# Patient Record
Sex: Female | Born: 1970 | Race: White | Hispanic: No | Marital: Married | Smoking: Former smoker
Health system: Southern US, Community
[De-identification: ages and names within clinical notes are randomized; demographics above are authoritative.]

## PROBLEM LIST (undated history)

## (undated) DIAGNOSIS — I639 Cerebral infarction, unspecified: Secondary | ICD-10-CM

## (undated) DIAGNOSIS — R569 Unspecified convulsions: Secondary | ICD-10-CM

## (undated) DIAGNOSIS — Q2730 Arteriovenous malformation, site unspecified: Secondary | ICD-10-CM

## (undated) HISTORY — DX: Cerebral infarction, unspecified: I63.9

## (undated) HISTORY — PX: ANKLE FRACTURE SURGERY: SHX122

---

## 2000-10-14 ENCOUNTER — Encounter: Payer: Self-pay | Admitting: Emergency Medicine

## 2000-10-14 ENCOUNTER — Inpatient Hospital Stay (HOSPITAL_COMMUNITY): Admission: AC | Admit: 2000-10-14 | Discharge: 2000-10-18 | Payer: Self-pay

## 2001-08-27 ENCOUNTER — Other Ambulatory Visit: Admission: RE | Admit: 2001-08-27 | Discharge: 2001-08-27 | Payer: Self-pay | Admitting: Obstetrics and Gynecology

## 2001-12-30 ENCOUNTER — Other Ambulatory Visit: Admission: RE | Admit: 2001-12-30 | Discharge: 2001-12-30 | Payer: Self-pay | Admitting: Obstetrics and Gynecology

## 2003-02-10 ENCOUNTER — Other Ambulatory Visit: Admission: RE | Admit: 2003-02-10 | Discharge: 2003-02-10 | Payer: Self-pay | Admitting: Obstetrics and Gynecology

## 2003-05-12 ENCOUNTER — Other Ambulatory Visit: Admission: RE | Admit: 2003-05-12 | Discharge: 2003-05-12 | Payer: Self-pay | Admitting: Obstetrics and Gynecology

## 2008-08-19 ENCOUNTER — Ambulatory Visit: Payer: Self-pay | Admitting: Neurology

## 2009-09-30 ENCOUNTER — Ambulatory Visit: Payer: Self-pay | Admitting: Obstetrics and Gynecology

## 2010-04-17 ENCOUNTER — Encounter: Payer: Self-pay | Admitting: Neurosurgery

## 2010-07-05 ENCOUNTER — Other Ambulatory Visit: Payer: Self-pay

## 2010-07-05 ENCOUNTER — Other Ambulatory Visit: Payer: Self-pay | Admitting: Allergy

## 2010-07-05 DIAGNOSIS — J309 Allergic rhinitis, unspecified: Secondary | ICD-10-CM

## 2012-06-03 ENCOUNTER — Emergency Department (HOSPITAL_COMMUNITY)
Admission: EM | Admit: 2012-06-03 | Discharge: 2012-06-03 | Disposition: A | Discharge status: Home / Self Care | Payer: Federal, State, Local not specified - PPO | Attending: Emergency Medicine | Admitting: Emergency Medicine

## 2012-06-03 ENCOUNTER — Emergency Department (HOSPITAL_COMMUNITY): Payer: Federal, State, Local not specified - PPO

## 2012-06-03 ENCOUNTER — Encounter (HOSPITAL_COMMUNITY): Payer: Self-pay | Admitting: *Deleted

## 2012-06-03 DIAGNOSIS — R110 Nausea: Secondary | ICD-10-CM | POA: Insufficient documentation

## 2012-06-03 DIAGNOSIS — G40109 Localization-related (focal) (partial) symptomatic epilepsy and epileptic syndromes with simple partial seizures, not intractable, without status epilepticus: Secondary | ICD-10-CM | POA: Insufficient documentation

## 2012-06-03 DIAGNOSIS — R51 Headache: Secondary | ICD-10-CM | POA: Insufficient documentation

## 2012-06-03 DIAGNOSIS — Z3202 Encounter for pregnancy test, result negative: Secondary | ICD-10-CM | POA: Insufficient documentation

## 2012-06-03 DIAGNOSIS — Z87891 Personal history of nicotine dependence: Secondary | ICD-10-CM | POA: Insufficient documentation

## 2012-06-03 DIAGNOSIS — Z8679 Personal history of other diseases of the circulatory system: Secondary | ICD-10-CM | POA: Insufficient documentation

## 2012-06-03 HISTORY — DX: Unspecified convulsions: R56.9

## 2012-06-03 HISTORY — DX: Arteriovenous malformation, site unspecified: Q27.30

## 2012-06-03 MED ORDER — METOCLOPRAMIDE HCL 5 MG/ML IJ SOLN
10.0000 mg | Freq: Once | INTRAMUSCULAR | Status: AC
  Administered 2012-06-03: 10 mg via INTRAVENOUS
  Filled 2012-06-03: qty 2

## 2012-06-03 MED ORDER — DIPHENHYDRAMINE HCL 50 MG/ML IJ SOLN
25.0000 mg | INTRAMUSCULAR | Status: AC
  Administered 2012-06-03: 25 mg via INTRAVENOUS
  Filled 2012-06-03: qty 1

## 2012-06-03 MED ORDER — LEVETIRACETAM 500 MG/5ML IV SOLN
INTRAVENOUS | Status: AC
  Filled 2012-06-03: qty 10

## 2012-06-03 MED ORDER — MORPHINE SULFATE 4 MG/ML IJ SOLN
4.0000 mg | Freq: Once | INTRAMUSCULAR | Status: AC
  Administered 2012-06-03: 4 mg via INTRAVENOUS
  Filled 2012-06-03: qty 1

## 2012-06-03 MED ORDER — SODIUM CHLORIDE 0.9 % IV SOLN
1000.0000 mg | Freq: Once | INTRAVENOUS | Status: AC
  Administered 2012-06-03: 1000 mg via INTRAVENOUS
  Filled 2012-06-03: qty 10

## 2012-06-03 NOTE — ED Notes (Signed)
Patient alert and oriented x 4. No acute distress noted. No seizure activity while in ED. Patient discharged to home with husband. Patient and husband verbalized understanding of discharge instructions.

## 2012-06-03 NOTE — ED Notes (Signed)
Patient w/hx AVM and seizures had seizure tonight.  Has been 1 year since last seizure.  Slight nausea.  No sz. Activity in route.  Sees neurologist at [facility].

## 2012-06-03 NOTE — ED Provider Notes (Signed)
History    This chart was scribed for Jennifer Booze, MD by Gerlean Ren, ED Scribe. This patient was seen in room APA04/APA04 and the patient's care was started at 7:25 PM    CSN: [id number]  Arrival date & time 06/03/12  1900   First MD Initiated Contact with Patient 06/03/12 1924      Chief Complaint  Patient presents with  . Seizures     The history is provided by the patient and the spouse. No language interpreter was used.  Jennifer Pope is a 42 y.o. female with h/o seizures and arteriovenous malformation brought in by ambulance to the Emergency Department complaining of a seizure that occurred today lasting approximately 10-20 seconds during which the pt was conscious and reports shaking in her left upper extremity and left lower extremity and loss of bowel control, which are typical to her h/o seizures.  Pt reports that she normally can sense when a seizure is about to occur but that she had no warning signs with this seizure.  Did not bite lips or tongue, and there are not associated injuries or traumas with the seizure.  Pt currently c/o HA and nausea.  Pt reports last seizure occurred one year ago.  Pt takes Sheralyn Boatman and has not missed any doses.  Pt denies tobacco use and reports daily alcohol use (2beers/day).   Neurologist is Dr. Amada Jupiter at [facility]   Past Medical History  Diagnosis Date  . Seizures   . Arteriovenous malformation     Past Surgical History  Procedure Laterality Date  . Ankle fracture surgery      No family history on file.  History  Substance Use Topics  . Smoking status: Former Games developer  . Smokeless tobacco: Not on file  . Alcohol Use: 1.2 oz/week    2 Cans of beer per week     Comment: 2 beers/day    OB History   Grav Para Term Preterm Abortions TAB SAB Ect Mult Living   0               Review of Systems  Gastrointestinal: Positive for nausea.  Skin: Negative for wound.  Neurological: Positive for seizures and headaches.  All other  systems reviewed and are negative.    Allergies  Review of patient's allergies indicates no known allergies.  Home Medications  No current outpatient prescriptions on file.  BP 102/57  Pulse 64  Temp(Src) 97.7 F (36.5 C) (Oral)  Resp 16  Ht 5\' 6"  (1.676 m)  Wt 150 lb (68.04 kg)  BMI 24.22 kg/m2  SpO2 96%  LMP 04/27/2012  Physical Exam  Nursing note and vitals reviewed. Constitutional: She is oriented to person, place, and time. She appears well-developed and well-nourished. No distress.  HENT:  Head: Normocephalic and atraumatic.  Eyes: Conjunctivae and EOM are normal. Pupils are equal, round, and reactive to light.  Fundi normal  Neck: Neck supple. No tracheal deviation present.  Cardiovascular: Normal rate, regular rhythm and normal heart sounds.   Pulmonary/Chest: Effort normal and breath sounds normal. No respiratory distress. She has no wheezes.  Musculoskeletal: Normal range of motion.  Neurological: She is alert and oriented to person, place, and time.  Skin: Skin is warm and dry.  Psychiatric: She has a normal mood and affect. Her behavior is normal.    ED Course  Procedures (including critical care time) DIAGNOSTIC STUDIES: Oxygen Saturation is 96% on room air, adequate by my interpretation.    COORDINATION OF CARE:  7:34 PM- Patient and husband informed of clinical course including nausea relief, pain relief, and consult with neurologist.  They understand medical decision-making process and agree with plan.  Ct Head Wo Contrast  06/03/2012  *RADIOLOGY REPORT*  Clinical Data: Seizure.  History of avium.  CT HEAD WITHOUT CONTRAST  Technique:  Contiguous axial images were obtained from the base of the skull through the vertex without contrast.  Comparison: None.  Findings: There is extensive hypodensity in the  white matter of the right frontal lobe without associated mass effect likely representing ischemic changes.  Dystrophic calcifications are present within  the medial right frontal lobe.  There is no evidence of midline shift or acute intracranial hemorrhage.  Cranium is intact.  Mastoid air cells are clear.  IMPRESSION: Chronic ischemic changes and dystrophic calcifications in the right frontal lobe are likely related to the patient's known history of AVM.  No evidence of acute hemorrhage or acute intracranial pathology.   Original Report Authenticated By: Jolaine Click, M.D.       1. 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 Her neurologist is at [facility] and I will attempt to contact him. She is currently on Keppra and blood levels are not obtainable for that. She is neurologically intact and I do not see any indication for any immediate change in treatment. She will be given metoclopramide for nausea since ondansetron did not give her relief. She was given morphine for her headache. I have attempted to review her records from [facility] but very limited information is available via EPIC.  Have discussed case with Dr. Micheline Maze at [facility] who has requested that a CT of the head be obtained to make sure that her AVM has not bled, and recommended increasing her Keppra from 1250 mg a day to 2000 mg a day. She will be seen in followup air either tomorrow or Friday.   I personally performed the services described in this documentation, which was scribed in my presence. The recorded information has been reviewed and is accurate.          Jennifer Booze, MD 06/03/12 [phone]

## 2012-06-04 LAB — POCT PREGNANCY, URINE: Preg Test, Ur: NEGATIVE

## 2012-11-06 ENCOUNTER — Encounter: Payer: Federal, State, Local not specified - PPO | Admitting: Neurology

## 2012-11-06 NOTE — Progress Notes (Deleted)
[facility]  Provider:  Dr Hosie Poisson Referring Provider: Cassell Clement, MD Primary Care Physician:  Cassell Clement, MD  CC:  seizures  HPI:  Jennifer Pope is a 42 y.o. female here as a referral from Dr. Waverly Ferrari for evaluation of seizures in patient with a known AVM  Last seizure was around 1 month ago? Similar to prior episodes? Start on left side? Aura? Keppra dose and compliance?  Per ED notes:   The history is provided by the patient and the spouse. No language interpreter was used.  Jennifer Pope is a 42 y.o. female with h/o seizures and arteriovenous malformation brought in by ambulance to the Emergency Department complaining of a seizure that occurred today lasting approximately 10-20 seconds during which the pt was conscious and reports shaking in her left upper extremity and left lower extremity and loss of bowel control, which are typical to her h/o seizures. Pt reports that she normally can sense when a seizure is about to occur but that she had no warning signs with this seizure. Did not bite lips or tongue, and there are not associated injuries or traumas with the seizure. Pt currently c/o HA and nausea. Pt reports last seizure occurred one year ago. Pt takes Sheralyn Boatman and has not missed any doses. Pt denies tobacco use and reports daily alcohol use (2beers/day).  Followed by radiation oncologist Dr. Amada Jupiter at [facility] CT from 05/2012, images reviewed and showed right frontal area of hypodensity with calcifications, consistent with known AVM   Review of Systems: Out of a complete 14 system review, the patient complains of only the following symptoms, and all other reviewed systems are negative. ***  History   Social History  . Marital Status: Married    Spouse Name: N/A    Number of Children: N/A  . Years of Education: N/A   Occupational History  . Not on file.   Social History Main Topics  . Smoking status: Former Games developer  .  Smokeless tobacco: Not on file  . Alcohol Use: 1.2 oz/week    2 Cans of beer per week     Comment: 2 beers/day  . Drug Use: No  . Sexual Activity: Yes    Birth Control/ Protection: None   Other Topics Concern  . Not on file   Social History Narrative  . No narrative on file    No family history on file.  Past Medical History  Diagnosis Date  . Seizures   . Arteriovenous malformation     Past Surgical History  Procedure Laterality Date  . Ankle fracture surgery      Current Outpatient Prescriptions  Medication Sig Dispense Refill  . levETIRAcetam (KEPPRA) 500 MG tablet Take 500-750 mg by mouth 2 (two) times daily. Take one tablet (500 MG) by mouth every morning then one and 1/2 tablet (750MG ) by mouth every evening       No current facility-administered medications for this visit.    Allergies as of 11/06/2012  . (No Known Allergies)    Vitals: There were no vitals taken for this visit. Last Weight:  Wt Readings from Last 1 Encounters:  06/03/12 150 lb (68.04 kg)   Last Height:   Ht Readings from Last 1 Encounters:  06/03/12 5\' 6"  (1.676 m)     Physical exam: Exam: Gen: NAD, conversant Eyes: anicteric sclerae, moist conjunctivae HENT: Atraumati Neck: Trachea midline; supple,  Lungs: CTA, no wheezing, rales, rhonic  CV: RRR, no MRG Abdomen: Soft, non-tender;  Extremities: No peripheral edema  Skin: Normal temperature, no rash,  Psych: Appropriate affect, pleasant  Neuro: MS: AA&Ox3, appropriately interactive, normal affect   Attention: WORLD backwards  Speech: fluent w/o paraphasic error  Memory: good recent and remote recall  CN: PERRL, EOMI no nystagmus, no ptosis, sensation intact to LT V1-V3 bilat, face symmetric, no weakness, hearing grossly intact, palate elevates symmetrically, shoulder shrug 5/5 bilat,  tongue protrudes midline, no fasiculations noted.  Motor: normal bulk and tone Strength: 5/5  In all  extremities  Coord: rapid alternating and point-to-point (FNF, HTS) movements intact.  Reflexes: symmetrical, bilat downgoing toes  Sens: LT intact in all extremities  Gait: posture, stance, stride and arm-swing normal. Tandem gait intact. Able to walk on heels and toes. Romberg absent.   Assessment:  After physical and neurologic examination, review of laboratory studies, imaging, neurophysiology testing and pre-existing records, assessment will be reviewed on the problem list.  Plan:  Treatment plan and additional workup will be reviewed under Problem List.

## 2012-11-07 NOTE — Progress Notes (Signed)
This encounter was created in error - please disregard.

## 2012-12-02 ENCOUNTER — Encounter: Payer: Self-pay | Admitting: Neurology

## 2012-12-02 ENCOUNTER — Ambulatory Visit (INDEPENDENT_AMBULATORY_CARE_PROVIDER_SITE_OTHER): Payer: Federal, State, Local not specified - PPO | Admitting: Neurology

## 2012-12-02 VITALS — BP 104/64 | HR 62 | Ht 67.0 in | Wt 169.0 lb

## 2012-12-02 DIAGNOSIS — R569 Unspecified convulsions: Secondary | ICD-10-CM | POA: Insufficient documentation

## 2012-12-02 DIAGNOSIS — Q2730 Arteriovenous malformation, site unspecified: Secondary | ICD-10-CM | POA: Insufficient documentation

## 2012-12-02 DIAGNOSIS — Q279 Congenital malformation of peripheral vascular system, unspecified: Secondary | ICD-10-CM

## 2012-12-02 MED ORDER — LEVETIRACETAM 500 MG PO TABS: 250.0000 mg | ORAL_TABLET | Freq: Two times a day (BID) | ORAL | Status: DC

## 2012-12-02 MED ORDER — LORAZEPAM 0.5 MG PO TABS: 0.5000 mg | ORAL_TABLET | ORAL | Status: AC | PRN

## 2012-12-02 MED ORDER — LEVETIRACETAM 1000 MG PO TABS: 1000.0000 mg | ORAL_TABLET | Freq: Every day | ORAL | Status: DC

## 2012-12-02 NOTE — Progress Notes (Signed)
[facility]  Provider:  Dr Hosie Poisson Referring Provider: Cassell Clement, MD Primary Care Physician:  Cassell Clement, MD  CC:  seizures  HPI:  Jennifer Pope is a 42 y.o. female here as a referral from Dr. Waverly Ferrari for evaluation of seizures in a patient with known AVM.   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 She was followed by oncology at [facility] and had stereotactic radiosurgery done, she completed this in March of 2011. She was initially put on Keppra 500 mg twice a day for her seizure episodes, this has been slowly titrated up and is currently taking 1000 mg twice a day. Up until March of 2014 her seizures remained simple partial involving sensory changes on the left side. In March she had what appears to be a complex partial seizure with flexion of the left arm, confusion, difficulty talking. This lasted a few minutes. She had a subsequent episode similar to this in July of 2014 which occurred during her sleep. In August she had what she thought was the start of one but she stopped it with Ativan. She reports good compliance with her Keppra. She also takes Ativan 0.5 mg as needed when she feels the aura coming on. Lately she feels that she has been using this on a more continuous daily basis. She feels that her mental cycle may trigger seizures. Denies any other triggers. No alcohol or tobacco use. Completed March 2011, diagnosed December 2010 after having seizures, left sided symptoms   Review of Systems: Out of a complete 14 system review, the patient complains of only the following symptoms, and all other reviewed systems are negative. Positive for headache numbness dizziness seizure insomnia decreased energy constipation  History   Social History  . Marital Status: Married    Spouse Name: Ladene Artist     Number of Children: 2  . 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   Social History Main Topics  . Smoking status: Former Smoker    Types: Cigarettes  . Smokeless tobacco: Never Used     Comment: Quit 16 years ago.  . Alcohol Use: 1.2 oz/week    2 Cans of beer per week     Comment: 2 beers/day  . Drug Use: No  . Sexual Activity: Yes    Birth Control/ Protection: None   Other Topics Concern  . Not on file   Social History Narrative   Patient lives at home with her husband Ladene Artist). Patient works for her self. Patient has her BA arts.   Caffeine- two daily coffee.   Right handed.    Family History  Problem Relation Age of Onset  . Lung cancer Father     Past Medical History  Diagnosis Date  . Seizures   . Arteriovenous malformation     Past Surgical History  Procedure Laterality Date  . Ankle fracture surgery Right     2002    Current Outpatient Prescriptions  Medication Sig Dispense Refill  . levETIRAcetam (KEPPRA) 1000 MG tablet Take 1,000 mg by mouth daily. Take 1 tablet (1,000 mg total) by mouth 2 (two) times daily.      Marland Kitchen levETIRAcetam (KEPPRA) 500 MG tablet Take 500-750 mg by mouth 2 (two) times daily. Take one tablet (500 MG) by mouth every morning then one  and 1/2 tablet (750MG ) by mouth every evening      . LORazepam (ATIVAN) 0.5 MG tablet Take 0.5 mg by mouth as needed. Take one tablet every 8 hrs as needed for onset of seizure       No current facility-administered medications for this visit.    Allergies as of 12/02/2012  . (No Known Allergies)    Vitals: BP 104/64  Pulse 62  Ht 5\' 7"  (1.702 m)  Wt 169 lb (76.658 kg)  BMI 26.46 kg/m2 Last Weight:  Wt Readings from Last 1 Encounters:  12/02/12 169 lb (76.658 kg)   Last Height:   Ht Readings from Last 1 Encounters:  12/02/12 5\' 7"  (1.702 m)     Physical exam: Exam: Gen: NAD, conversant Eyes: anicteric sclerae, moist conjunctivae HENT:  Atraumati Neck: Trachea midline; supple,  Lungs: CTA, no wheezing, rales, rhonic                          CV: RRR, no MRG Abdomen: Soft, non-tender;  Extremities: No peripheral edema  Skin: Normal temperature, no rash,  Psych: Appropriate affect, pleasant  Neuro: MS: AA&Ox3, appropriately interactive, normal affect   Attention: WORLD backwards  Speech: fluent w/o paraphasic error  Memory: good recent and remote recall  CN: PERRL, EOMI no nystagmus, no ptosis, sensation intact to LT V1-V3 bilat, face symmetric, no weakness, hearing grossly intact, palate elevates symmetrically, shoulder shrug 5/5 bilat,  tongue protrudes midline, no fasiculations noted.  Motor: normal bulk and tone Strength: 5/5  In all extremities  Coord: rapid alternating and point-to-point (FNF, HTS) movements intact.  Reflexes: symmetrical, bilat downgoing toes  Sens: LT intact in all extremities except for decreased light touch on left upper extremity from a low to finger taps and left lower 70 from the 2 toes  Gait: posture, stance, stride and arm-swing normal. Tandem gait intact. Able to walk on heels and toes. Romberg absent.   Assessment:  After physical and neurologic examination, review of laboratory studies, imaging, neurophysiology testing and pre-existing records, assessment will be reviewed on the problem list.  Plan:  Treatment plan and additional workup will be reviewed under Problem List.  Ms Weisheit is a pleasant 42 year old woman presents for initial evaluation of seizures with a known history of right frontal AVM. She is status post stereotactic radiosurgery at [facility]. She reports having what she feels is increased seizure frequency. She is currently taking Keppra 1 g twice a day and Ativan as needed. Her physical exam is pertinent for mild decrease sensation in the left side otherwise unremarkable. We extensively talked about different therapeutic options, at this time we will  increase the Keppra to 1250 twice a day with option of going up to 1500 mg twice a day. She will also continue to use Ativan when necessary, counseled her that she may need to increase the Ativan usage during her menstrual cycle if this is a trigger.  1)Seizures 2)AVM  -increase keppra to 1250mg  bid -ativan prn for breakthrough seizures -follow up with [facility] -keep appointment with [facility]   -follow up as needed

## 2012-12-02 NOTE — Patient Instructions (Signed)
Overall you are doing fairly well but I do want to suggest a few things today:   Remember to drink plenty of fluid, eat healthy meals and do not skip any meals. Try to eat protein with a every meal and eat a healthy snack such as fruit or nuts in between meals. Try to keep a regular sleep-wake schedule and try to exercise daily, particularly in the form of walking, 20-30 minutes a day, if you can.   As far as your medications are concerned, I would like to suggest increasing the keppra to 1250mg  twice a day. If this does not improve your seizure frequency then we can consider increasing to 1500mg  twice a day.   Continue to use the ativan as needed for breakthrough seizures.   I would like to see you back in 4 to 6 months, sooner if we need to. Please call us with any interim questions, concerns, problems, updates or refill requests.   My clinical assistant and will answer any of your questions and relay your messages to me and also relay most of my messages to you.   Our phone number is [phone]. We also have an after hours call service for urgent matters and there is a physician on-call for urgent questions. For any emergencies you know to call 911 or go to the nearest emergency room

## 2012-12-04 ENCOUNTER — Telehealth: Payer: Self-pay

## 2012-12-04 MED ORDER — LEVETIRACETAM 500 MG PO TABS: 250.0000 mg | ORAL_TABLET | Freq: Two times a day (BID) | ORAL | Status: AC

## 2012-12-04 MED ORDER — LEVETIRACETAM 1000 MG PO TABS: 1000.0000 mg | ORAL_TABLET | Freq: Two times a day (BID) | ORAL | Status: AC

## 2012-12-04 NOTE — Telephone Encounter (Signed)
Iona Hansen from [facility] called, left message wanting to clarify Keppra Rx's.  One Rx has two sets of instructions.  The OV notes say: increase keppra to 1250mg  bid.  I have updated and resent Rx.

## 2013-04-06 ENCOUNTER — Ambulatory Visit: Payer: Federal, State, Local not specified - PPO | Admitting: Neurology

## 2014-05-12 IMAGING — CT CT HEAD W/O CM
1 of 2 series · 16 of 30 positions shown, 20 images · non-contrast
Comparison: None.

CLINICAL DATA: Seizure.  History of avium.

CT HEAD WITHOUT CONTRAST
TECHNIQUE: Contiguous axial images were obtained from the base of
the skull through the vertex without contrast.

[Series 3: headtrauma 2.4 h60s · axial · 0.43mm/px · z∈[+115,+276]mm · 16 of 72 slices shown, 20 images]
[im 4/72  brain]
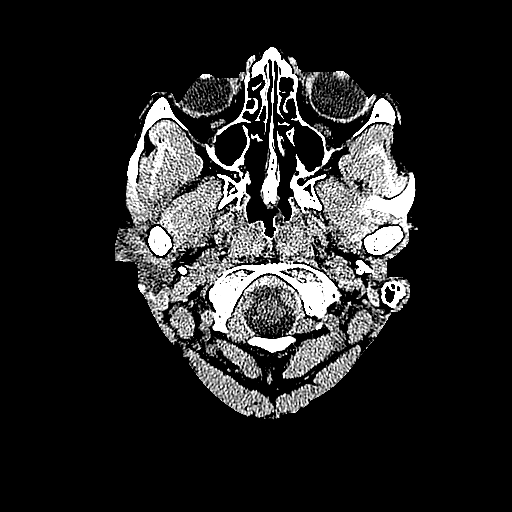
[im 4/72  bone]
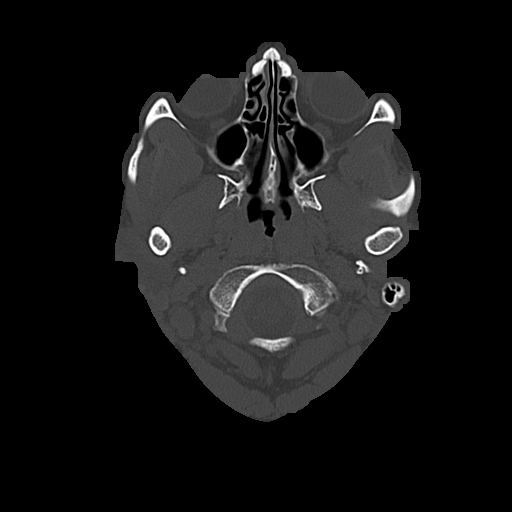
[im 8/72  brain]
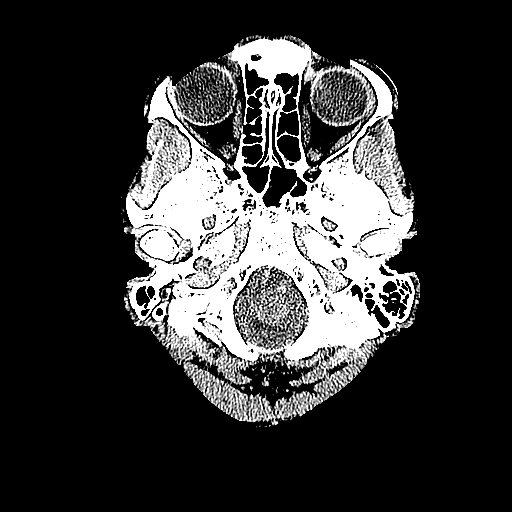
[im 12/72  brain]
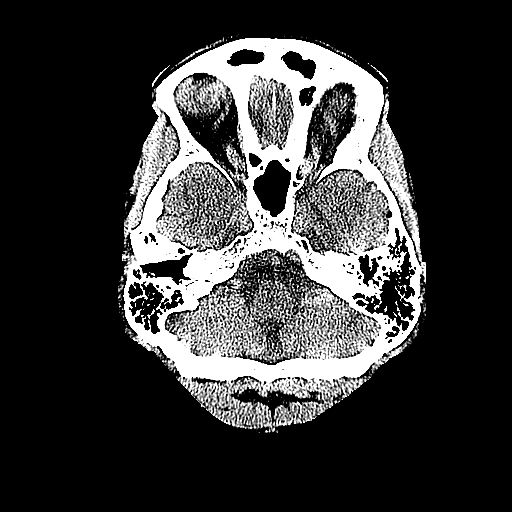
[im 15/72  brain]
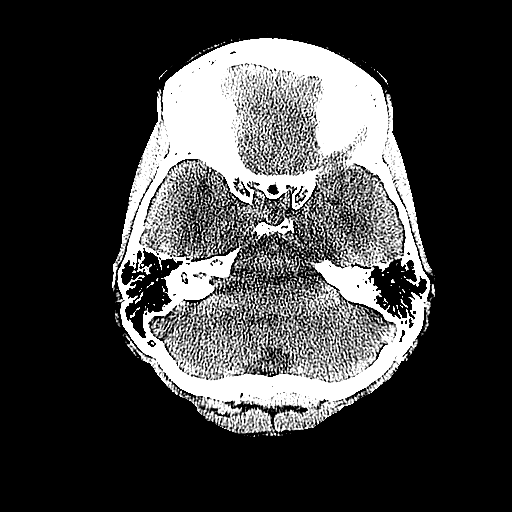
[im 23/72  brain]
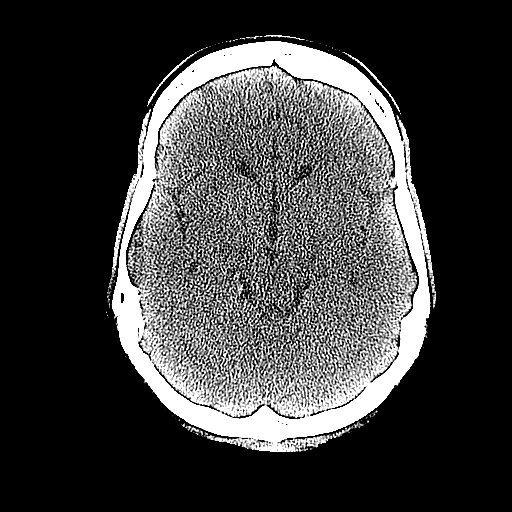
[im 23/72  bone]
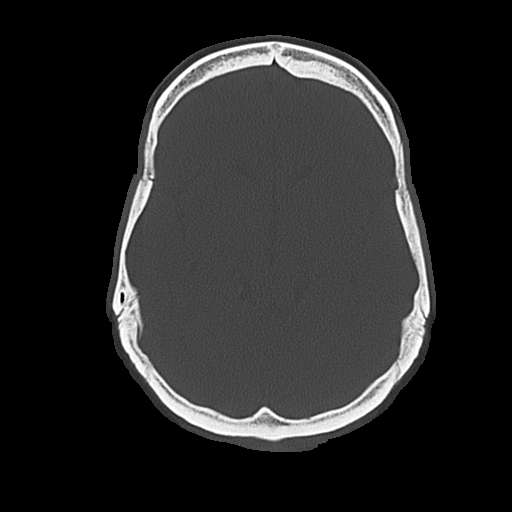
[im 27/72  brain]
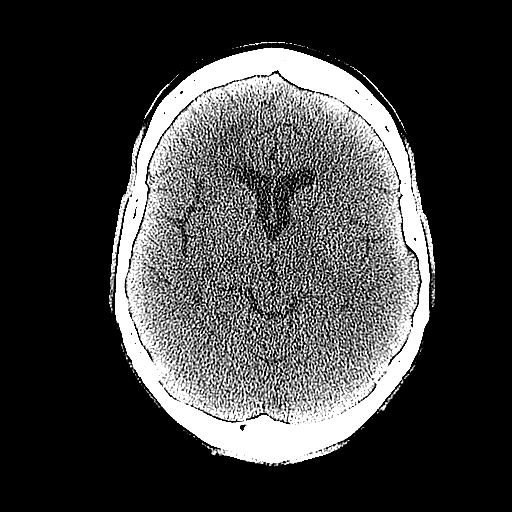
[im 30/72  brain]
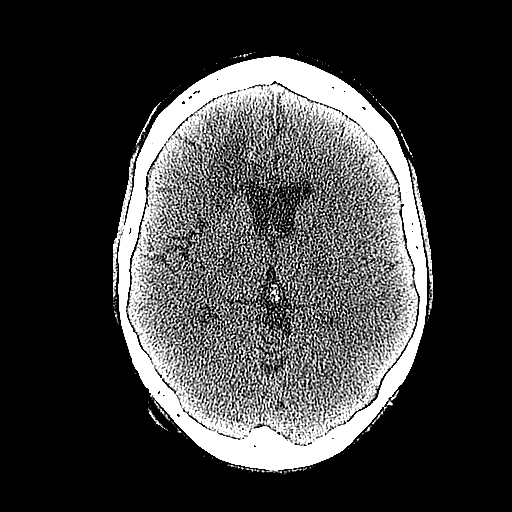
[im 34/72  brain]
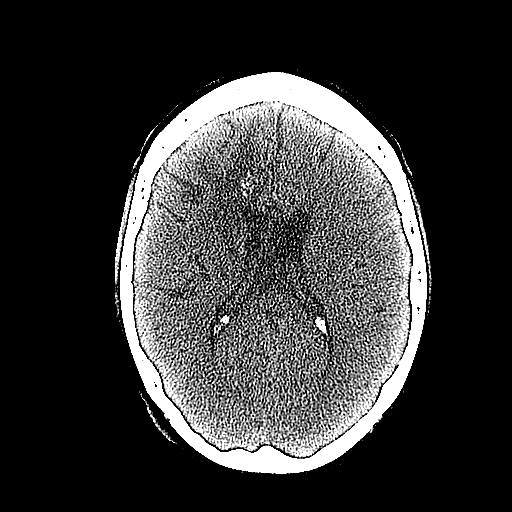
[im 38/72  brain]
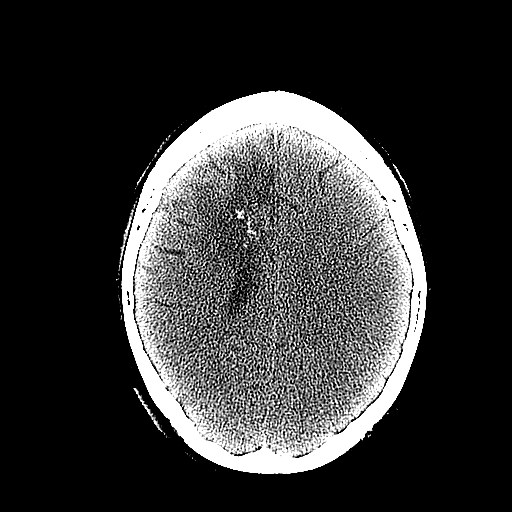
[im 38/72  bone]
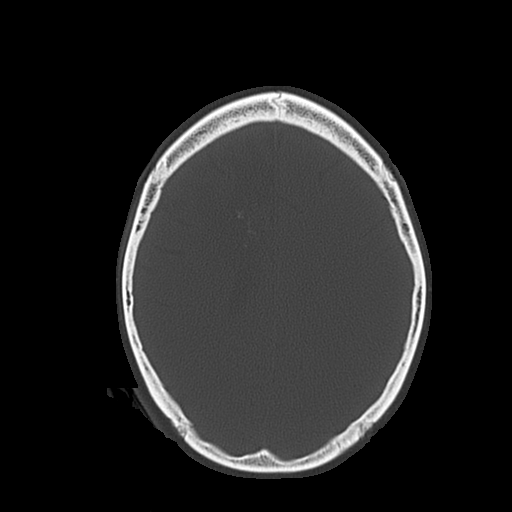
[im 42/72  brain]
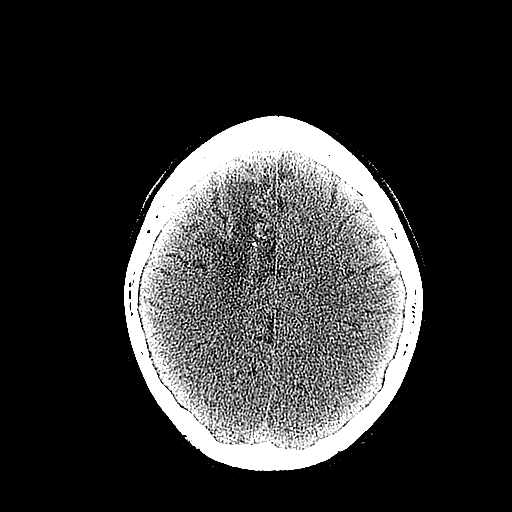
[im 45/72  brain]
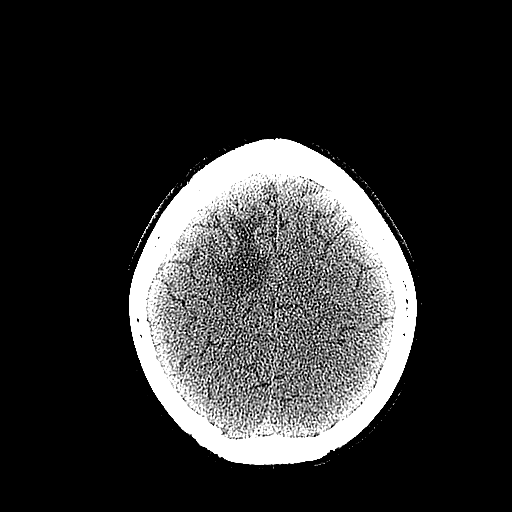
[im 49/72  brain]
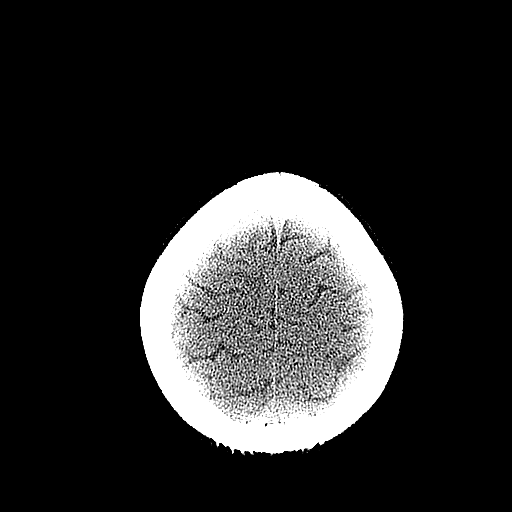
[im 57/72  brain]
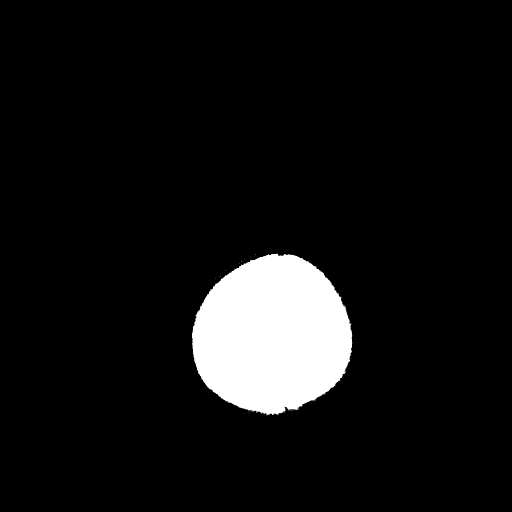
[im 57/72  bone]
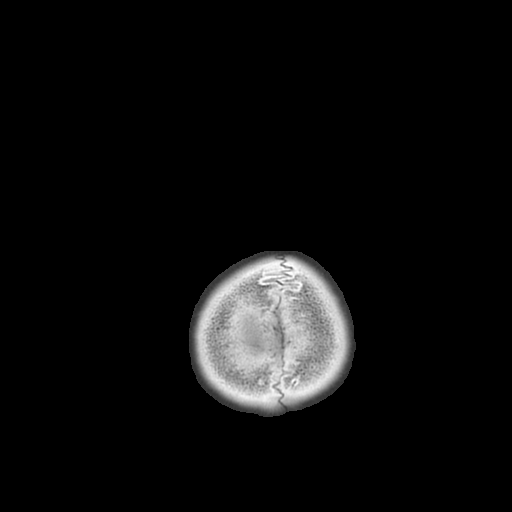
[im 60/72  brain]
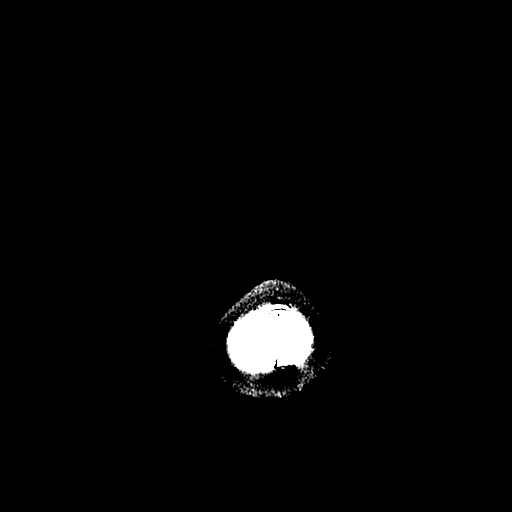
[im 64/72  brain]
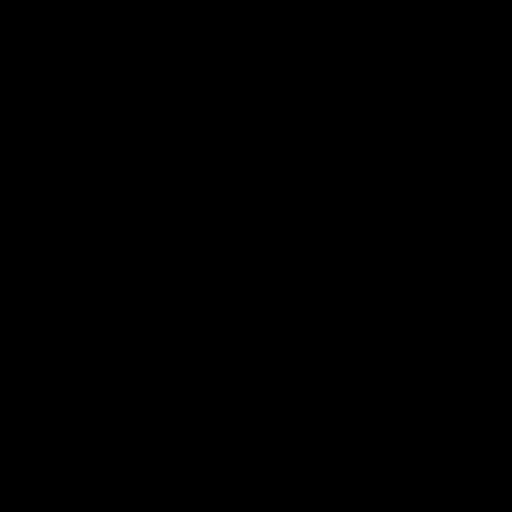
[im 68/72  brain]
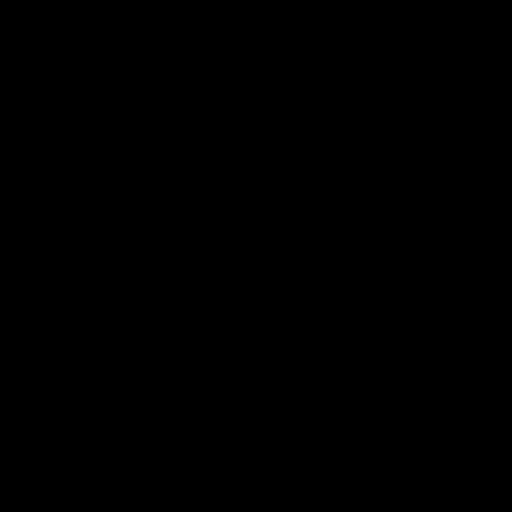

[16 of 30 positions shown; findings below may reference images not displayed]

FINDINGS: There is extensive hypodensity in the  white matter of
the right frontal lobe without associated mass effect likely
representing ischemic changes.  Dystrophic calcifications are
present within the medial right frontal lobe.  There is no evidence
of midline shift or acute intracranial hemorrhage.  Cranium is
intact.  Mastoid air cells are clear.
IMPRESSION: Chronic ischemic changes and dystrophic calcifications in the right
frontal lobe are likely related to the patient's known history of
AVM.  No evidence of acute hemorrhage or acute intracranial
pathology.

## 2014-08-09 ENCOUNTER — Encounter (HOSPITAL_COMMUNITY): Payer: Federal, State, Local not specified - PPO | Admitting: Occupational Therapy

## 2014-08-10 ENCOUNTER — Encounter: Payer: Federal, State, Local not specified - PPO | Admitting: Occupational Therapy

## 2014-08-10 ENCOUNTER — Ambulatory Visit: Payer: Federal, State, Local not specified - PPO

## 2014-08-10 ENCOUNTER — Ambulatory Visit (HOSPITAL_COMMUNITY)
Payer: Federal, State, Local not specified - PPO | Attending: Physical Medicine and Rehabilitation | Admitting: Physical Therapy

## 2014-08-10 DIAGNOSIS — R262 Difficulty in walking, not elsewhere classified: Secondary | ICD-10-CM

## 2014-08-10 DIAGNOSIS — G8114 Spastic hemiplegia affecting left nondominant side: Secondary | ICD-10-CM | POA: Diagnosis not present

## 2014-08-10 DIAGNOSIS — R29898 Other symptoms and signs involving the musculoskeletal system: Secondary | ICD-10-CM

## 2014-08-10 DIAGNOSIS — I6911 Cognitive deficits following nontraumatic intracerebral hemorrhage: Secondary | ICD-10-CM | POA: Diagnosis not present

## 2014-08-10 DIAGNOSIS — S43112S Subluxation of left acromioclavicular joint, sequela: Secondary | ICD-10-CM | POA: Diagnosis not present

## 2014-08-10 DIAGNOSIS — I615 Nontraumatic intracerebral hemorrhage, intraventricular: Secondary | ICD-10-CM | POA: Diagnosis not present

## 2014-08-10 DIAGNOSIS — R6889 Other general symptoms and signs: Secondary | ICD-10-CM | POA: Diagnosis not present

## 2014-08-10 DIAGNOSIS — R269 Unspecified abnormalities of gait and mobility: Secondary | ICD-10-CM

## 2014-08-10 NOTE — Therapy (Signed)
[facility] [address] [place], [place], [address] Phone: [phone]   Fax:  [phone]  Physical Therapy Evaluation  Patient Details  Name: Jennifer Pope MRN: [id number] Date of Birth: 1971-03-05 Referring Provider:  Derrell Lollingrenstein, Raphael, MD  Encounter Date: 08/10/2014      PT End of Session - 08/10/14 1954    Visit Number 1   Number of Visits 16   Date for PT Re-Evaluation 09/09/14   Authorization Type BCBS   Authorization - Visit Number 1   Authorization - Number of Visits 16   PT Start Time 1301   PT Stop Time 1350   PT Time Calculation (min) 49 min   Activity Tolerance Patient tolerated treatment well   Behavior During Therapy [facility] for tasks assessed/performed      Past Medical History  Diagnosis Date  . Seizures   . Arteriovenous malformation     Past Surgical History  Procedure Laterality Date  . Ankle fracture surgery Right     2002    There were no vitals filed for this visit.  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 Lt AFO for prevention of knee yper extension an dprevention of Lt foot drop. pain its predominantly in shoulder secondary to Lt shoulder subluxation for which patient will be seeing OT. patient arriveswwearing helmet for protection of brain flap.    How long can you walk comfortably? patien table to ambualte .25 miles, maybe les on verying terrain.    Patient Stated Goals Patinet want to be able to walk without a walker, wants to improve balance. horse back riding.    Currently in Pain? No/denies            [facility] - 08/10[facility]14 0001    Assessment   Medical Diagnosis Stroke   Onset Date 05/23/14   Next MD Visit  Derrell LollingRaphael orenstein   Prior Therapy yes   Balance Screen   Has the patient fallen in the past 6 months No   Has the patient had a decrease in activity level because of a fear of falling?  No   Is the patient reluctant to leave their home because of a fear of falling?  No   Prior Function   Level of Independence Independent with basic ADLs   Sensation   Light Touch Appears Intact   Coordination   Gross Motor Movements are Fluid and Coordinated No   Heel Shin Test unable to perform due to weakness   Functional Tests   Functional tests Sit to Stand   Sit to Stand   Comments 5x sit to stand 13.9 seconds.    Other:   Other/ Comments walking 7610ft: 7.27sec   ROM / Strength   AROM / PROM / Strength AROM;Strength   AROM   AROM Assessment Site Hip;Knee;Ankle;Lumbar   Right/Left Hip Right;Left   Right Hip Internal Rotation  20   Left Hip Flexion 20   Strength   Strength Assessment Site Lumbar;Ankle;Knee;Hip   Right/Left Hip Right;Left   Right Hip Flexion 4+/5   Left Hip Flexion 4-/5   Right/Left Knee Right;Left   Right Knee Flexion 4+/5   Right Knee Extension 5/5  Left Knee Flexion 2-/5   Left Knee Extension 2-/5   Right/Left Ankle Right;Left   Right Ankle Dorsiflexion 4+/5   Left Ankle Dorsiflexion 0/5   Left Ankle Plantar Flexion 2-/5   Standardized Balance Assessment   Standardized Balance Assessment Berg Balance Test   Berg Balance Test   Sit to Stand Able to stand without using hands and stabilize independently   Standing Unsupported Able to stand 2 minutes with supervision   Sitting with Back Unsupported but Feet Supported on Floor or Stool Able to sit 2 minutes under supervision   Stand to Sit Sits safely with minimal use of hands   Transfers Able to transfer safely, definite need of hands   Standing Unsupported with Eyes Closed Able to stand 10 seconds with supervision   Standing Ubsupported with Feet Together Able to place feet together independently and stand 1 minute  safely   From Standing, Reach Forward with Outstretched Arm Can reach forward >12 cm safely (5")   From Standing Position, Pick up Object from Floor Able to pick up shoe, needs supervision   From Standing Position, Turn to Look Behind Over each Shoulder Looks behind one side only/other side shows less weight shift   Turn 360 Degrees Needs close supervision or verbal cueing   Standing Unsupported, Alternately Place Feet on Step/Stool Able to complete >2 steps/needs minimal assist   Standing Unsupported, One Foot in Front Loses balance while stepping or standing   Standing on One Leg Unable to try or needs assist to prevent fall   Total Score 35            [facility] Adult PT Treatment/Exercise - 08/10/14 0001    Exercises   Exercises Knee/Hip   Knee/Hip Exercises: Standing   Other Standing Knee Exercises 3D hip excursions 10x   Other Standing Knee Exercises Splitstance 3D hip excursions 10x            PT Education - 08/10/14 1953    Education provided Yes   Education Details Patient given HEP and explained diagnosis/prognosis and progression of exercises.    Person(s) Educated Patient   Methods Explanation;Demonstration;Handout   Comprehension Verbalized understanding;Returned demonstration          PT Short Term Goals - 08/10/14 2005    PT SHORT TERM GOAL #1   Title Patient will demosntrate increased ankel dorsiflexion strength of 2/5 MMT to decrease risk of trippign over Lt foot due to Lt foot drop.   Time 4   Period Weeks   Status New   PT SHORT TERM GOAL #2   Title Patient will demonstrate increased Lt hamstring/calf strength of 4-/5 MMT to decrease risk of Lt knee hyper extension.    Time 4   Period Weeks   Status New   PT SHORT TERM GOAL #3   Title Patient will dmeosntrate increased Lt hip flexion of 4/5 MMT to be able to ambulate with improved stride length.    Time 4   Period Weeks   Status New   PT SHORT TERM GOAL #4   Title patient will be abel to ambualte  with a SPC for 100 ft   Time 4   Period Weeks   Status New   PT SHORT TERM GOAL #5   Title I with HEP for progression of strength   Time 4   Period Weeks   Status New           PT Long Term Goals - 08/10/14 2009  PT LONG TERM GOAL #1   Title Patient will demonstrate increased ankle dorsiflexion strength of 3/5 MMT to decrease risk of tripping over Lt foot due to Lt foot drop   Time 8   Period Weeks   Status New   PT LONG TERM GOAL #2   Title Patient will demonstrate increased Lt hamstring/calf strength of 5/5 MMT to decrease risk of Lt knee hyper extension.    Time 8   Period Weeks   Status New   PT LONG TERM GOAL #3   Title Patient will demosntrate increased Lt hip extension of 4/5 MMT to be able to ambulate with improved stride length and perofmr sit to stand without dependence of Rt LE for perofrmance of sit to stand.    Time 8   Period Weeks   Status New   PT LONG TERM GOAL #4   Title Patient will be able to ambulate up and down stairs with only 1 HHA.    Time 8   Period Weeks   Status New   PT LONG TERM GOAL #5   Title Patient will demonstrate increased abdominal strength of 5/5 MMT to be able ride horses.    Time 8   Period Weeks   Status New            Plan - 08/10/14 1955    Clinical Impression Statement Patient displays Lt sided weakness resulting in dififculty walking s/p CVA. Patient specifically displays a high risk of falls and difficulty walking due to impaired balance and Lt LE weakness  including Lt foot drop  and occasional knee hyper extension due to weakness of gastrocs. Patient will beenfit from skilled physical therapy to increase Lt LE strength to increase independence with walking and decrease dependence on assistive devices to safety with ambulation.    Pt will benefit from skilled therapeutic intervention in order to improve on the following deficits Abnormal gait;Decreased strength;Difficulty walking;Decreased activity tolerance;Decreased  balance;Impaired flexibility;Decreased endurance   Rehab Potential Good   PT Frequency 2x / week   PT Duration 8 weeks   PT Treatment/Interventions Therapeutic exercise;Balance training;Neuromuscular re-education;Patient/family education;Gait training;Manual techniques;Functional mobility training;Therapeutic activities   PT Next Visit Plan introduce LE strengthening Exercises: Bridges, Total gym squats, and box lunges   PT Home Exercise Plan 3D hip excursions, split stance 3Dhip excursions 10x   Consulted and Agree with Plan of Care Patient         Problem List Patient Active Problem List   Diagnosis Date Noted  . Seizures 12/02/2012  . AVM (arteriovenous malformation) 12/02/2012   Jerilee Fieldash Bohden Dung PT DPT [phone]  [facility] [address] [place], [place], [address] Phone: [phone]   Fax:  [phone]

## 2014-08-11 ENCOUNTER — Encounter (HOSPITAL_COMMUNITY): Payer: Self-pay | Admitting: Specialist

## 2014-08-11 ENCOUNTER — Ambulatory Visit (HOSPITAL_COMMUNITY): Payer: Federal, State, Local not specified - PPO | Admitting: Specialist

## 2014-08-11 ENCOUNTER — Ambulatory Visit (HOSPITAL_COMMUNITY): Payer: Federal, State, Local not specified - PPO | Admitting: Speech Pathology

## 2014-08-11 DIAGNOSIS — I69119 Unspecified symptoms and signs involving cognitive functions following nontraumatic intracerebral hemorrhage: Secondary | ICD-10-CM

## 2014-08-11 DIAGNOSIS — S43112S Subluxation of left acromioclavicular joint, sequela: Secondary | ICD-10-CM

## 2014-08-11 DIAGNOSIS — G8114 Spastic hemiplegia affecting left nondominant side: Secondary | ICD-10-CM | POA: Diagnosis not present

## 2014-08-11 DIAGNOSIS — R490 Dysphonia: Secondary | ICD-10-CM

## 2014-08-11 DIAGNOSIS — R2689 Other abnormalities of gait and mobility: Secondary | ICD-10-CM

## 2014-08-11 DIAGNOSIS — G8110 Spastic hemiplegia affecting unspecified side: Secondary | ICD-10-CM

## 2014-08-11 NOTE — Therapy (Signed)
[facility] [address] [place], [place], [address] Phone: [phone]   Fax:  [phone]  Speech Language Pathology Evaluation  Patient Details  Name: Jennifer Pope MRN: [id number] Date of Birth: 09/23/1970 Referring Provider:  Derrell Lolling, MD  Encounter Date: 08/11/2014      End of Session - 08/11/14 1241    Visit Number 1   Number of Visits 16   Date for SLP Re-Evaluation 10/11/14   Authorization Type BCBS federal   Authorization Time Period 08/11/2014-10/11/2014   Authorization - Visit Number 1   Authorization - Number of Visits 16   SLP Start Time [phone]   SLP Stop Time  1041   SLP Time Calculation (min) 59 min   Activity Tolerance Patient tolerated treatment well      Past Medical History  Diagnosis Date  . Seizures   . Arteriovenous malformation     Past Surgical History  Procedure Laterality Date  . Ankle fracture surgery Right     2002    There were no vitals filed for this visit.  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 No/denies   General Information   Other Pertinent Information Mrs. Jennifer "Waynetta Sandy" Pope is a 44 year old woman with a known h/o AVM previously treated with SRS in 2010 who presented to ER on 05/23/2014 with acute  onset left-sided weakness. 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 She was emergently taken to OR for hemicraniotomy for evacuation of hematoma, subsequently she underwent trach and PEG placement which have since been removed. 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 The bone flap is currently being stored until swelling has subsided and pt expects this will be replaced in 6-8 months. She was on a modified diet for some time, but was discharged from [facility] on a regular diet with thin liquids (no longer needs to utilize chin tuck). Pt made tremendous progress while at [facility] and was discharged with mild cognitive deficits (previously mod/severe) and dysphonia. She is referred for outpatient SLP therapy to address residual deficits and increase independence/maximize functional return. She has good family support from her husband.    Behavioral/Cognition Mild impairment   Mobility Status Walks with hemi-walker   Prior Functional Status   Cognitive/Linguistic Baseline Baseline deficits   Baseline deficit details mild short term memory deficits s/p radiation therapy for AVM in 2010, but functional   Type of Home House    Lives With Spouse   Available Support Family   Education college education   Vocation Self employed  Paediatric nurse, owns barn   Cognition   Overall Cognitive Status Impaired/Different  from baseline   Area of Impairment Orientation;Attention;Memory   Orientation Level Time   General Comments off by 2 for day/date   Current Attention Level Sustained   Memory Decreased short-term memory   Memory Comments recalled 3/4 words after 8 minute delay without cue; 4/4 with cue   Awareness Impaired   Awareness Impairment Emergent impairment   Problem Solving Appears intact   Auditory Comprehension   Overall Auditory Comprehension Appears within functional limits for tasks assessed   Yes/No  Questions Within Functional Limits   Commands Within Functional Limits   Conversation Complex   Interfering Components Attention;Working Personal assistantmemory   Visual Recognition/Discrimination   Discrimination Not tested   Reading Comprehension   Reading Status Not tested   Expression   Primary Mode of Expression Verbal   Verbal Expression   Overall Verbal Expression Appears within functional limits for tasks assessed   Initiation No impairment   Level of Generative/Spontaneous Verbalization Conversation   Repetition No impairment   Naming No impairment   Pragmatics No impairment   Interfering Components --  dysphonia   Non-Verbal Means of Communication Not applicable   Written Expression   Dominant Hand Right   Written Expression Within Functional Limits   Oral Motor/Sensory Function   Overall Oral Motor/Sensory Function Impaired   Labial ROM Reduced left   Labial Strength Reduced Left   Labial Coordination Reduced   Lingual Symmetry Abnormal symmetry left   Lingual Strength Reduced   Lingual Sensation Within Functional Limits   Lingual Coordination WFL   Facial ROM Within Functional Limits   Facial Symmetry Within Functional Limits   Facial Strength Within Functional Limits   Facial Sensation Within Functional Limits   Facial Coordination WFL   Velum Within Functional Limits   Mandible Within Functional Limits  appears WFL however pt reports decrease ROM   Overall Oral Motor/Sensory Function mild impairment   Motor Speech   Overall Motor Speech Impaired   Respiration Impaired   Level of Impairment Sentence   Phonation Breathy   Resonance Within functional limits   Articulation Within functional limitis   Intelligibility Intelligible   Motor Planning Witnin functional limits   Motor Speech Errors Not applicable   Effective Techniques Increased vocal intensity   Phonation Impaired   Volume Soft   Pitch Appropriate   Standardized Assessments   Standardized Assessments  --   informal measures                SLP Short Term Goals - 08/11/14 1248    SLP SHORT TERM GOAL #1   Title Pt will increase maximum exhalation time to 15 seconds with use of breath support exercises and mild/mod cues   Baseline 6 seconds   Time 4   Period Weeks   Status New   SLP SHORT TERM GOAL #2   Title Pt will complete moderate level working memory tasks with 95% acc with use of compensatory strategies with mild/mod cues.   Baseline 75%   Time 4   Period Weeks   Status New   SLP SHORT TERM GOAL #3   Title Pt will be oriented to time and complex situations with 100% acc with use of calendar/planner and min cues.   Baseline 75%   Time 4   Period Weeks   Status New   SLP SHORT TERM GOAL #4   Title Pt will increase vocal intensity to [facility] for small group setting with use of compensatory strategies and mild/mod cues from SLP.  Baseline mild/mod impairment   Time 4   Period Weeks   Status New          SLP Long Term Goals - 08/11/14 1250    SLP LONG TERM GOAL #1   Title Pt will demonstrate improved phonation to [facility] for small group setting with use of strategies.   Baseline mild/mod impairment   Time 8   Period Weeks   Status New   SLP LONG TERM GOAL #2   Title Pt will increase attention and memory skills to [facility] for independent environment with use of compensatory strategies as needed.    Baseline supervision   Time 8   Period Weeks   Status New          Plan - 08/11/14 1242    Clinical Impression Statement Mrs. Karleen HampshireSpencer presents with mild cognitive deficits characterized by decreased attention, left neglect, and decreased working/short term memory. 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 She improved significantly while in inpatient rehab setting and would continue to benefit from skilled SLP intervention to maximize function, increase independence, and decrease burden of care.    Speech  Therapy Frequency 2x / week   Duration --  8 weeks   Treatment/Interventions Cueing hierarchy;Compensatory techniques;Cognitive reorganization;Internal/external aids;Oral motor exercises;SLP instruction and feedback;Patient/family education;Compensatory strategies   Potential to Achieve Goals Good   SLP Home Exercise Plan Pt will be independent with HEP to facilitate carryover of treatment strategies in home/community environment.   Consulted and Agree with Plan of Care Patient;Family member/caregiver   Family Member Consulted husband, Derrick        Problem List Patient Active Problem List   Diagnosis Date Noted  . Seizures 12/02/2012  . AVM (arteriovenous malformation) 12/02/2012   Thank you,  Havery MorosDabney Briseyda Fehr, CCC-SLP [phone]  Taran Haynesworth 08/11/2014, 3:04 PM  [facility] [address] [place], [place], [address] Phone: [phone]   Fax:  [phone]

## 2014-08-11 NOTE — Therapy (Signed)
[facility] [address] [place], [place], [address] Phone: [phone]   Fax:  [phone]  Occupational Therapy Evaluation  Patient Details  Name: Jennifer Pope MRN: [id number] Date of Birth: 1970/06/23 Referring Provider:  Derrell Lolling, MD  Encounter Date: 08/11/2014      OT End of Session - 08/11/14 2306    Visit Number 1   Number of Visits 36   Date for OT Re-Evaluation 10/10/14  mini reassess on 09/08/14   Authorization Type n/a   OT Start Time 0900   OT Stop Time 0940   OT Time Calculation (min) 40 min   Activity Tolerance Patient tolerated treatment well   Behavior During Therapy [facility] for tasks assessed/performed      Past Medical History  Diagnosis Date  . Seizures   . Arteriovenous malformation   . Stroke     Past Surgical History  Procedure Laterality Date  . Ankle fracture surgery Right     2002    There were no vitals filed for this visit.  Visit Diagnosis:  Spastic hemiplegia affecting nondominant side  Subluxation of left acromioclavicular joint, sequela  Decreased functional mobility      Subjective Assessment - 08/11/14 2251    Subjective  S: I want to get my arm working again.   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 She was hospitalized at [facility] for 2 1/2 months with approximately 1 month being intensive inpatient rehab.  She has been referred to occupational thearpy for evaluation and treatment.     Patient Stated Goals I want to use my left arm and gain my balance.   Currently in Pain? 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  No   Is the patient reluctant to leave their home because of a fear of falling?  Yes   Home  Environment   Family/patient expects to be discharged to: Private residence   Living Arrangements Spouse/significant other   Available Help at Discharge Family   Type of Home House   Bathroom Shower/Tub Walk-in Shower   Bathroom Accessibility Yes   Home Equipment Tub bench  hemi walker   Lives With Spouse  Jennifer Pope   ADL   Eating/Feeding Set up   Grooming Set up   Upper Body Bathing Minimal assistance   Lower Body Bathing Minimal assistance   Upper Body Dressing Moderate assistance   Lower Body Dressing Moderate assistance   Toilet Tranfer Min guard   Toileting - Clothing Manipulation Minimal assistance   Where Assessed - Toileting Clothing Manipulationn Set up   Tub/Shower Transfer Min guard   ADL comments unable to use LUE at all with activities    Mobility   Mobility Status Needs assist   Mobility Status Comments ambulates  with min guard with hemiwalker   Written Expression   Dominant Hand Right   Vision - History   Baseline Vision Wears glasses all the time   Visual History Brain injury   Patient Visual Report Nausea/blurring vision with head movement   Additional Comments recently went to [facility] - vision improved since stroke   Vision Assessment   Vision Assessment Vision not tested   Comment [facility]  Patient does have left side neglect as demonstrated by bumping into left wall when wheeling chair and drifitng to right when ambulating   Sensation   Light Touch Appears Intact   Coordination   Gross Motor Movements are Fluid and Coordinated No   Fine Motor Movements are Fluid and Coordinated No   Perception   Inattention/Neglect  Impaired - to be further tested in functual context   Tone   Assessment Location Left Upper Extremity   ROM / Strength   AROM / PROM / Strength PROM;AROM   Palpation   Palpation 2 digit subluxation in left shoulder - patient wearing sling for correction  left scapula with signifcant winging and rigid in place   AROM   Overall AROM Comments 0 AROM in left upper extremity   PROM   Overall PROM Comments PROM of LUE is WFL   LUE Tone   LUE Tone Hypotonic;Brunnstrom Scale   Brunnstrom Scale (LUE) Slight increase in muscle tone, manifested by a catch and release or by minimal resistance at the end of the range of motion when the affected part(s) is moved in flexion or extension                         OT Education - 08/11/14 2305    Education provided Yes   Education Details Educated on positioning in bed to lay on left side.  educated on weightbearing on forearm.   Person(s) Educated Patient;Spouse   Methods Explanation;Demonstration;Handout   Comprehension Verbalized understanding;Returned demonstration          OT Short Term Goals - 08/11/14 2311    OT SHORT TERM GOAL #1   Title Patient will be educated on a HEP.   Time 6   Period Weeks   Status New   OT SHORT TERM GOAL #2   Title Patient will use her left arm as a gross assist with daily activites.   Time 6   Period Weeks   Status New   OT SHORT TERM GOAL #3   Title Patient will decrease subluxation in left shoulder to 1.5 digits for decreased pain with shoulder use.   Time 6   Period Weeks   Status New   OT SHORT TERM GOAL #4   Title Patient will wieghbear on her left arm with max pa while competing functional activiites.    Time 6   Period Weeks   Status New   OT SHORT TERM GOAL #5   Title Patient will have trace mobility in her LUE for increased ability to complete BADLs.   Time 6   Period Weeks   Status New   Additional Short Term Goals   Additional Short Term Goals Yes   OT SHORT TERM GOAL  #6   Title Patient will complete bathing and dressing with close min guard.    Time 6   Period Weeks   Status New   OT SHORT TERM GOAL #7   Title Patient will attend to  left side during ADLs and functional ambulation 50% of time.    Time 6   Period Weeks           OT Long Term Goals - 08/11/14 2315    OT LONG TERM GOAL #1   Title Patient will use her left arm as an active assist with daily tasks.   Time 12   Period Weeks   Status New   OT LONG TERM GOAL #2   Title Patient will have 50% volitional movement in her left arm.   Time 12   Period Weeks   Status New   OT LONG TERM GOAL #3   Title Patient will be able to weightbear with min pa or less.   Time 12   Period Weeks   OT LONG TERM GOAL #4   Title Patient will decrease subluxation to 1 digit or less in right shoulder region.    Time 12   Period Weeks   Status New   OT LONG TERM GOAL #5   Title Patient will attend to left independently.   Time 12   Period Weeks   Status New   Long Term Additional Goals   Additional Long Term Goals Yes   OT LONG TERM GOAL #6   Title Patient will improve to Brunnstrom stage IV in LUE.   Time 12   Period Weeks   Status New   OT LONG TERM GOAL #7   Title Patient wll complete all ADLs and ambulation with Mod I.   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 [facility] with approximately 1 month being intensive inpatient rehab.  She has been referred to occupational thearpy for evaluation and treatment.     Pt will benefit from skilled therapeutic intervention in order to improve on the following deficits (Retired) Decreased coordination;Decreased endurance;Decreased range of motion;Decreased strength;Impaired tone;Impaired UE functional use;Impaired vision/preception   Rehab Potential Good   OT Frequency 3x / week   OT  Duration 12 weeks   OT Treatment/Interventions Self-care/ADL training;Electrical Stimulation;Moist Heat;Therapeutic exercise;Neuromuscular education;Energy conservation;DME and/or AE instruction;Passive range of motion;Therapeutic exercises;Patient/family education;Therapeutic activities;Visual/perceptual remediation/compensation   Plan P:  Skilled OT intervention to improve independence with B/IADLS, work, increase volitional use of LUE with daily activities.  Next session:  Weightbearing, PROM, closed chain activities    Consulted and Agree with Plan of Care Patient        Problem List Patient Active Problem List   Diagnosis Date Noted  . Seizures 12/02/2012  . AVM (arteriovenous malformation) 12/02/2012    Shirlean MylarBethany H. Murray, OTR/L [phone]  08/11/2014, 11:22 PM  [facility] [address] [place], [place], [address] Phone: [phone]   Fax:  [phone]

## 2014-08-12 ENCOUNTER — Ambulatory Visit: Payer: Federal, State, Local not specified - PPO

## 2014-08-12 ENCOUNTER — Encounter: Payer: Federal, State, Local not specified - PPO | Admitting: Occupational Therapy

## 2014-08-13 ENCOUNTER — Ambulatory Visit (HOSPITAL_COMMUNITY): Payer: Federal, State, Local not specified - PPO | Admitting: Specialist

## 2014-08-13 DIAGNOSIS — G8114 Spastic hemiplegia affecting left nondominant side: Secondary | ICD-10-CM | POA: Diagnosis not present

## 2014-08-13 DIAGNOSIS — S43112S Subluxation of left acromioclavicular joint, sequela: Secondary | ICD-10-CM

## 2014-08-13 DIAGNOSIS — IMO0002 Reserved for concepts with insufficient information to code with codable children: Secondary | ICD-10-CM

## 2014-08-13 DIAGNOSIS — G8110 Spastic hemiplegia affecting unspecified side: Secondary | ICD-10-CM

## 2014-08-13 NOTE — Therapy (Signed)
[facility] [address] [place], [place], [address] Phone: [phone]   Fax:  [phone]  Occupational Therapy Treatment  Patient Details  Name: Jennifer Pope MRN: [id number] Date of Birth: 1970/07/14 Referring Provider:  Derrell Lolling, MD  Encounter Date: 08/13/2014      OT End of Session - 08/13/14 1634    Visit Number 2   Number of Visits 36   Date for OT Re-Evaluation 10/10/14  mini reassess 09/08/14   Authorization Type n/a   OT Start Time 1025   OT Stop Time 1115   OT Time Calculation (min) 50 min   Activity Tolerance Patient tolerated treatment well   Behavior During Therapy [facility] for tasks assessed/performed      Past Medical History  Diagnosis Date  . Seizures   . Arteriovenous malformation   . Stroke     Past Surgical History  Procedure Laterality Date  . Ankle fracture surgery Right     2002    There were no vitals filed for this visit.  Visit Diagnosis:  Spastic hemiplegia affecting nondominant side  Subluxation of left acromioclavicular joint, sequela  Weakness due to cerebrovascular accident      Subjective Assessment - 08/13/14 1614    Subjective  S:  Since you told me I could take the sling off some, we have been trying to do that alot.   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   Onset Date 05/23/14   Precautions   Precautions Fall;Other (comment)                  OT Treatments/Exercises (OP) - 08/13/14 0001    Bed Mobility   Bed Mobility Supine to Sit   Supine to Sit 5: Supervision  max facilitation to incorporate LUE   Neurological Re-education Exercises   Scapular Stabilization Left;Supine;5 reps   Shoulder Flexion PROM;AAROM;10 reps   Shoulder ABduction PROM;AAROM;10 reps;Supine   Shoulder Protraction PROM;AAROM;10 reps;Supine    Shoulder External Rotation PROM;AAROM;10 reps;Supine   Shoulder Internal Rotation PROM;AAROM;10 reps;Supine   Elbow Flexion PROM;AAROM;10 reps;Supine   Elbow Extension PROM;AAROM;10 reps;Supine   Forearm Supination PROM;AAROM;10 reps;Supine   Forearm Pronation PROM;AAROM;10 reps;Supine   Wrist Flexion PROM;AAROM;10 reps;Supine   Wrist Extension PROM;AAROM;10 reps;Supine   Finger Flexion active grasp this date 10 times, able to relax digits however not able to actively release   Seated with weight on hand sat on mat weightbearing on extended LUE with OT providing facilitation at elbow and hand to maintain positioning.  weightshifted on and off LUE 20 times while completing functional task.   Development of Reach Hand over hand assistance   Hand over Hand Assistance while Reaching sat edge of mat and with max pa grasped a large peg, reach forward with closed chain mod assistance, and dropped peg with max facilitation to open hand 5 repetitions   Manual Therapy   Manual Therapy Myofascial release   Myofascial Release MFR to left shoulder region to decrease tightness in shoulder girdle musculature.                    OT Short Term Goals - 08/11/14 2311    OT SHORT TERM GOAL #1   Title Patient will be educated on a HEP.   Time 6  Period Weeks   Status New   OT SHORT TERM GOAL #2   Title Patient will use her left arm as a gross assist with daily activites.   Time 6   Period Weeks   Status New   OT SHORT TERM GOAL #3   Title Patient will decrease subluxation in left shoulder to 1.5 digits for decreased pain with shoulder use.   Time 6   Period Weeks   Status New   OT SHORT TERM GOAL #4   Title Patient will wieghbear on her left arm with max pa while competing functional activiites.    Time 6   Period Weeks   Status New   OT SHORT TERM GOAL #5   Title Patient will have trace mobility in her LUE for increased ability to complete BADLs.   Time 6   Period Weeks   Status New    Additional Short Term Goals   Additional Short Term Goals Yes   OT SHORT TERM GOAL #6   Title Patient will complete bathing and dressing with close min guard.    Time 6   Period Weeks   Status New   OT SHORT TERM GOAL #7   Title Patient will attend to left side during ADLs and functional ambulation 50% of time.    Time 6   Period Weeks           OT Long Term Goals - 08/11/14 2315    OT LONG TERM GOAL #1   Title Patient will use her left arm as an active assist with daily tasks.   Time 12   Period Weeks   Status New   OT LONG TERM GOAL #2   Title Patient will have 50% volitional movement in her left arm.   Time 12   Period Weeks   Status New   OT LONG TERM GOAL #3   Title Patient will be able to weightbear with min pa or less.   Time 12   Period Weeks   OT LONG TERM GOAL #4   Title Patient will decrease subluxation to 1 digit or less in right shoulder region.    Time 12   Period Weeks   Status New   OT LONG TERM GOAL #5   Title Patient will attend to left independently.   Time 12   Period Weeks   Status New   Long Term Additional Goals   Additional Long Term Goals Yes   OT LONG TERM GOAL #6   Title Patient will improve to Brunnstrom stage IV in LUE.   Time 12   Period Weeks   Status New   OT LONG TERM GOAL #7   Title Patient wll complete all ADLs and ambulation with Mod I.   Time 12   Period Weeks   Status New               Plan - 08/13/14 1635    Clinical Impression Statement A:  Patient in supine and able to initiate and move through at least 50% range with arm unweighted by OT using OT hand as closed chain mechanism.  Subluxation at 1.5 digit this date.   Plan P:  Continue to facilitate normal movement pattern in LUE with NDT techniques.  Fabricate resting hand splint for use at night.        Problem List Patient Active Problem List   Diagnosis Date Noted  . Seizures 12/02/2012  . AVM (arteriovenous malformation) 12/02/2012  Shirlean MylarBethany H. Murray, OTR/L [phone]  08/13/2014, 4:38 PM  [facility] [address] [place], [place], [address] Phone: [phone]   Fax:  [phone]

## 2014-08-16 ENCOUNTER — Encounter (HOSPITAL_COMMUNITY): Payer: Federal, State, Local not specified - PPO | Admitting: Occupational Therapy

## 2014-08-17 ENCOUNTER — Encounter: Payer: Federal, State, Local not specified - PPO | Admitting: Occupational Therapy

## 2014-08-17 ENCOUNTER — Ambulatory Visit: Payer: Federal, State, Local not specified - PPO

## 2014-08-18 ENCOUNTER — Ambulatory Visit (HOSPITAL_COMMUNITY): Payer: Federal, State, Local not specified - PPO | Admitting: Specialist

## 2014-08-18 DIAGNOSIS — IMO0002 Reserved for concepts with insufficient information to code with codable children: Secondary | ICD-10-CM

## 2014-08-18 DIAGNOSIS — G8114 Spastic hemiplegia affecting left nondominant side: Secondary | ICD-10-CM | POA: Diagnosis not present

## 2014-08-18 DIAGNOSIS — G8110 Spastic hemiplegia affecting unspecified side: Secondary | ICD-10-CM

## 2014-08-18 NOTE — Therapy (Signed)
[facility] [address] [place], [place], [address] Phone: [phone]   Fax:  [phone]  Occupational Therapy Treatment  Patient Details  Name: Jennifer Pope MRN: [id number] Date of Birth: 05-Mar-1971 Referring Provider:  Derrell Lolling, MD  Encounter Date: 08/18/2014      OT End of Session - 08/18/14 1550    Visit Number 3   Number of Visits 36   Date for OT Re-Evaluation 10/10/14  mini reassess on 09/08/14   Authorization Type n/a   OT Start Time 1025   OT Stop Time 1110   OT Time Calculation (min) 45 min   Activity Tolerance Patient tolerated treatment well   Behavior During Therapy [facility] for tasks assessed/performed      Past Medical History  Diagnosis Date  . Seizures   . Arteriovenous malformation   . Stroke     Past Surgical History  Procedure Laterality Date  . Ankle fracture surgery Right     2002    There were no vitals filed for this visit.  Visit Diagnosis:  Spastic hemiplegia affecting nondominant side  Weakness due to cerebrovascular accident      Subjective Assessment - 08/18/14 1543    Subjective  S:  I had a good weekend.  The trip away was good.     Patient is accompained by: Family member   Currently in Pain? 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   Onset Date 05/23/14   Precautions   Precautions Fall;Other (comment)   Precaution Comments left side neglect                  OT Treatments/Exercises (OP) - 08/18/14 0001    Bed Mobility   Bed Mobility Sit to Supine;Supine to Sit   Supine to Sit 5: Supervision  max facilitation to incorporate LUE as wb, min pa iwth LLE   Exercises   Exercises Neurological Re-education   Neurological Re-education Exercises   Shoulder Flexion AAROM;5 reps   Shoulder ABduction AAROM;5 reps   Shoulder Protraction AAROM;5 reps   Shoulder  External Rotation AAROM;5 reps   Shoulder Internal Rotation AAROM;5 reps   Elbow Flexion AAROM;10 reps   Elbow Extension AAROM;10 reps   Forearm Supination AAROM;5 reps   Forearm Pronation AAROM;5 reps   Wrist Flexion AAROM;5 reps   Wrist Extension AAROM;5 reps   Finger Flexion active grasp this date 10 times, able to relax digits however not able to actively release   Splinting   Splinting OTR/L fabricated a left volar based resting hand splint, positioning the patient in netural position.  Patient educated to wear the splint at nights and during restbreaks.  OTR/L educated patients sister on donning and doffing techniques and wearing schedule.     Manual Therapy   Manual Therapy Myofascial release   Myofascial Release MFR to trapezius and supraspinatus to decrease restrictions.                 OT Education - 08/18/14 1549    Education provided Yes   Education Details Educated patient on splint wear and care, contraindications, donning and doffing techniques.    Person(s) Educated Patient;Caregiver(s)   Methods Explanation;Demonstration   Comprehension Verbalized understanding          OT Short Term Goals - 08/18/14 1553    OT  SHORT TERM GOAL #1   Title Patient will be educated on a HEP.   Time 6   Period Weeks   Status On-going   OT SHORT TERM GOAL #2   Title Patient will use her left arm as a gross assist with daily activites.   Time 6   Period Weeks   Status On-going   OT SHORT TERM GOAL #3   Title Patient will decrease subluxation in left shoulder to 1.5 digits for decreased pain with shoulder use.   Time 6   Period Weeks   Status On-going   OT SHORT TERM GOAL #4   Title Patient will wieghbear on her left arm with max pa while competing functional activiites.    Time 6   Period Weeks   Status On-going   OT SHORT TERM GOAL #5   Title Patient will have trace mobility in her LUE for increased ability to complete BADLs.   Time 6   Period Weeks   Status  Achieved   OT SHORT TERM GOAL #6   Title Patient will complete bathing and dressing with close min guard.    Period Weeks   Status On-going   OT SHORT TERM GOAL #7   Title Patient will attend to left side during ADLs and functional ambulation 50% of time.    Period Weeks   Status On-going           OT Long Term Goals - 08/18/14 1554    OT LONG TERM GOAL #1   Title Patient will use her left arm as an active assist with daily tasks.   Time 12   Period Weeks   Status On-going   OT LONG TERM GOAL #2   Title Patient will have 50% volitional movement in her left arm.   Time 12   Period Weeks   Status On-going   OT LONG TERM GOAL #3   Title Patient will be able to weightbear with min pa or less.   Time 12   Period Weeks   Status On-going   OT LONG TERM GOAL #4   Title Patient will decrease subluxation to 1 digit or less in right shoulder region.    Time 12   Period Weeks   Status On-going   OT LONG TERM GOAL #5   Title Patient will attend to left independently.   Time 12   Period Weeks   Status On-going   OT LONG TERM GOAL #6   Title Patient will improve to Brunnstrom stage IV in LUE.   Time 12   Period Weeks   Status On-going   OT LONG TERM GOAL #7   Title Patient wll complete all ADLs and ambulation with Mod I.   Period Weeks   Status On-going               Plan - 08/18/14 1550    Clinical Impression Statement A:  Fabricated resting hand splint for left arm this date to decrease amount of flexion occuring in wrist and hand while sleeping.  Discussed importance of volitional use of LUE and why it is harder to complete actitvities independently (the weight of her arm prohibits active movement).   Plan P:  Follow up on splint fit.          Problem List Patient Active Problem List   Diagnosis Date Noted  . Seizures 12/02/2012  . AVM (arteriovenous malformation) 12/02/2012    Shirlean Mylar, OTR/L [phone]  08/18/2014, 3:56 PM  [facility] [address] [place], [place], [address] Phone: [phone]   Fax:  [phone]

## 2014-08-19 ENCOUNTER — Encounter: Payer: Federal, State, Local not specified - PPO | Admitting: Occupational Therapy

## 2014-08-19 ENCOUNTER — Ambulatory Visit: Payer: Federal, State, Local not specified - PPO

## 2014-08-20 ENCOUNTER — Ambulatory Visit (HOSPITAL_COMMUNITY): Payer: Federal, State, Local not specified - PPO | Admitting: Occupational Therapy

## 2014-08-20 ENCOUNTER — Ambulatory Visit (HOSPITAL_COMMUNITY): Payer: Federal, State, Local not specified - PPO | Admitting: Specialist

## 2014-08-20 DIAGNOSIS — IMO0002 Reserved for concepts with insufficient information to code with codable children: Secondary | ICD-10-CM

## 2014-08-20 DIAGNOSIS — G8110 Spastic hemiplegia affecting unspecified side: Secondary | ICD-10-CM

## 2014-08-20 DIAGNOSIS — G8114 Spastic hemiplegia affecting left nondominant side: Secondary | ICD-10-CM | POA: Diagnosis not present

## 2014-08-20 NOTE — Therapy (Signed)
[facility] [address] [place], [place], [address] Phone: [phone]   Fax:  [phone]  Occupational Therapy Treatment  Patient Details  Name: Jennifer Pope MRN: [id number] Date of Birth: Jul 30, 1970 Referring Provider:  Cassell Clement, MD  Encounter Date: 08/20/2014      OT End of Session - 08/20/14 1705    Visit Number 4   Number of Visits 36   Date for OT Re-Evaluation 10/10/14  mini reassess 09/08/14   OT Start Time 1500   OT Stop Time 1550   OT Time Calculation (min) 50 min   Activity Tolerance Patient tolerated treatment well   Behavior During Therapy [facility] for tasks assessed/performed      Past Medical History  Diagnosis Date  . Seizures   . Arteriovenous malformation   . Stroke     Past Surgical History  Procedure Laterality Date  . Ankle fracture surgery Right     2002    There were no vitals filed for this visit.  Visit Diagnosis:  Spastic hemiplegia affecting nondominant side  Weakness due to cerebrovascular accident      Subjective Assessment - 08/20/14 1607    Subjective  S: I have noticed my arm is tighter lately.  Maybe because Im using it more.   Currently in Pain? 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   Onset Date 05/23/14   Precautions   Precautions Fall;Other (comment)   Precaution Comments left side neglect                  OT Treatments/Exercises (OP) - 08/20/14 0001    Bed Mobility   Bed Mobility Sit to Supine;Supine to Sit   Supine to Sit 5: Supervision  increased ability to use LUE as a gross assist with transfer   Neurological Re-education Exercises   Scapular Stabilization Seated;Left;10 reps   Other Information completed elevation with anterior and posterior facilitation from OT patient then leaning into therapist to increase the amount of elevation, after 10  repetitions, patient able to maintain position with shoulder with subluxation diminshed to less than 1/2 digit.  completed shoulder retraction and extension 10 times with min faciliation for extension and mod for retraction.   Shoulder Flexion PROM;AAROM;10 reps   Shoulder ABduction PROM;AAROM;10 reps   Shoulder Protraction PROM;AAROM;10 reps   Shoulder External Rotation PROM;AAROM;10 reps   Shoulder Internal Rotation PROM;AROM;10 reps   Elbow Flexion PROM;AAROM;10 reps   Elbow Extension PROM;AAROM;10 reps   Forearm Supination PROM;AAROM;10 reps   Forearm Pronation PROM;AAROM;10 reps   Wrist Flexion PROM;AAROM;10 reps   Wrist Extension PROM;AAROM;10 reps   Finger Flexion active grasp this date 10 times, able to relax digits however not able to actively release   Seated with weight on hand sat on mat weightbearing on extended LUE with OT providing facilitation at elbow and hand to maintain positioning. weightshifted on and off LUE 20 times while completing functional task.  Folded towels with right hand while weightbearing on left upper extremity.    Development of Reach Hand over hand assistance   Hand over Hand Assistance while Reaching supine reaching with protraction, internal rotation and flexion and external rotation and flexion with min to mod facilitation, reaching with flexion to low target easiest for patient.  sate edge of mat to grasp a bean bag with  min facilitation, reach forward using closed chain and min-mod facilitation, drop bean bag with mod facilitation and bring arm back with min facilitation  Patient greatly improved with this task by end of session                OT Education - 08/20/14 1704    Education Details discussed stages of tone development with patient and encouraged her to complete extension, elevation, retraction to decrease subluxation.    Person(s) Educated Patient;Caregiver(s)   Methods Explanation;Demonstration   Comprehension Verbalized  understanding;Returned demonstration          OT Short Term Goals - 08/18/14 1553    OT SHORT TERM GOAL #1   Title Patient will be educated on a HEP.   Time 6   Period Weeks   Status On-going   OT SHORT TERM GOAL #2   Title Patient will use her left arm as a gross assist with daily activites.   Time 6   Period Weeks   Status On-going   OT SHORT TERM GOAL #3   Title Patient will decrease subluxation in left shoulder to 1.5 digits for decreased pain with shoulder use.   Time 6   Period Weeks   Status On-going   OT SHORT TERM GOAL #4   Title Patient will wieghbear on her left arm with max pa while competing functional activiites.    Time 6   Period Weeks   Status On-going   OT SHORT TERM GOAL #5   Title Patient will have trace mobility in her LUE for increased ability to complete BADLs.   Time 6   Period Weeks   Status Achieved   OT SHORT TERM GOAL #6   Title Patient will complete bathing and dressing with close min guard.    Period Weeks   Status On-going   OT SHORT TERM GOAL #7   Title Patient will attend to left side during ADLs and functional ambulation 50% of time.    Period Weeks   Status On-going           OT Long Term Goals - 08/18/14 1554    OT LONG TERM GOAL #1   Title Patient will use her left arm as an active assist with daily tasks.   Time 12   Period Weeks   Status On-going   OT LONG TERM GOAL #2   Title Patient will have 50% volitional movement in her left arm.   Time 12   Period Weeks   Status On-going   OT LONG TERM GOAL #3   Title Patient will be able to weightbear with min pa or less.   Time 12   Period Weeks   Status On-going   OT LONG TERM GOAL #4   Title Patient will decrease subluxation to 1 digit or less in right shoulder region.    Time 12   Period Weeks   Status On-going   OT LONG TERM GOAL #5   Title Patient will attend to left independently.   Time 12   Period Weeks   Status On-going   OT LONG TERM GOAL #6   Title  Patient will improve to Brunnstrom stage IV in LUE.   Time 12   Period Weeks   Status On-going   OT LONG TERM GOAL #7   Title Patient wll complete all ADLs and ambulation with Mod I.   Period Weeks   Status On-going  Plan - 08/20/14 1705    Clinical Impression Statement A:  Patient able to resolve subluxation with positioning for approx 3 seconds this date.  Increased tone this date throughout entire arm, improved control of LUE with all closed chain activities.  Greatly improved participation and control in development of reach.  Followed up on splint and it is fitting well.    Plan P:  Continue development of reach and weightbearing to decrease subluxation and improve normal movement patterns in LUE.  Kinesiotape for subluxation.   Consulted and Agree with Plan of Care Patient        Problem List Patient Active Problem List   Diagnosis Date Noted  . Seizures 12/02/2012  . AVM (arteriovenous malformation) 12/02/2012    Shirlean MylarBethany H. Alyna Stensland, OTR/L [phone]  08/20/2014, 5:08 PM  [facility] [address] [place], [place], [address] Phone: [phone]   Fax:  [phone]

## 2014-08-24 ENCOUNTER — Ambulatory Visit: Payer: Federal, State, Local not specified - PPO

## 2014-08-24 ENCOUNTER — Encounter: Payer: Federal, State, Local not specified - PPO | Admitting: Occupational Therapy

## 2014-08-25 ENCOUNTER — Ambulatory Visit (HOSPITAL_COMMUNITY): Payer: Federal, State, Local not specified - PPO | Admitting: Physical Therapy

## 2014-08-25 ENCOUNTER — Ambulatory Visit (HOSPITAL_COMMUNITY)
Payer: Federal, State, Local not specified - PPO | Attending: Physical Medicine and Rehabilitation | Admitting: Specialist

## 2014-08-25 DIAGNOSIS — S43112S Subluxation of left acromioclavicular joint, sequela: Secondary | ICD-10-CM | POA: Diagnosis not present

## 2014-08-25 DIAGNOSIS — R269 Unspecified abnormalities of gait and mobility: Secondary | ICD-10-CM

## 2014-08-25 DIAGNOSIS — I6911 Cognitive deficits following nontraumatic intracerebral hemorrhage: Secondary | ICD-10-CM | POA: Diagnosis not present

## 2014-08-25 DIAGNOSIS — I69351 Hemiplegia and hemiparesis following cerebral infarction affecting right dominant side: Secondary | ICD-10-CM | POA: Diagnosis present

## 2014-08-25 DIAGNOSIS — R531 Weakness: Secondary | ICD-10-CM | POA: Insufficient documentation

## 2014-08-25 DIAGNOSIS — R2689 Other abnormalities of gait and mobility: Secondary | ICD-10-CM | POA: Diagnosis not present

## 2014-08-25 DIAGNOSIS — R262 Difficulty in walking, not elsewhere classified: Secondary | ICD-10-CM

## 2014-08-25 DIAGNOSIS — R29898 Other symptoms and signs involving the musculoskeletal system: Secondary | ICD-10-CM

## 2014-08-25 DIAGNOSIS — I69898 Other sequelae of other cerebrovascular disease: Secondary | ICD-10-CM | POA: Diagnosis not present

## 2014-08-25 DIAGNOSIS — R278 Other lack of coordination: Secondary | ICD-10-CM | POA: Insufficient documentation

## 2014-08-25 DIAGNOSIS — M25512 Pain in left shoulder: Secondary | ICD-10-CM | POA: Diagnosis not present

## 2014-08-25 DIAGNOSIS — IMO0002 Reserved for concepts with insufficient information to code with codable children: Secondary | ICD-10-CM

## 2014-08-25 DIAGNOSIS — I69354 Hemiplegia and hemiparesis following cerebral infarction affecting left non-dominant side: Secondary | ICD-10-CM | POA: Insufficient documentation

## 2014-08-25 DIAGNOSIS — G8110 Spastic hemiplegia affecting unspecified side: Secondary | ICD-10-CM

## 2014-08-25 NOTE — Therapy (Signed)
[facility] [address] [place], [place], [address] Phone: [phone]   Fax:  [phone]  Occupational Therapy Treatment  Patient Details  Name: Jennifer Pope MRN: [id number] Date of Birth: October 05, 1970 Referring Provider:  Derrell Lolling, MD  Encounter Date: 08/25/2014      OT End of Session - 08/25/14 1645    Visit Number 5   Number of Visits 36   Date for OT Re-Evaluation 10/10/14  mini reassess 6/15   OT Start Time 1435   OT Stop Time 1518   OT Time Calculation (min) 43 min   Activity Tolerance Patient tolerated treatment well   Behavior During Therapy [facility] for tasks assessed/performed      Past Medical History  Diagnosis Date  . Seizures   . Arteriovenous malformation   . Stroke     Past Surgical History  Procedure Laterality Date  . Ankle fracture surgery Right     2002    There were no vitals filed for this visit.  Visit Diagnosis:  Spastic hemiplegia affecting nondominant side  Weakness due to cerebrovascular accident  Subluxation of left acromioclavicular joint, sequela      Subjective Assessment - 08/25/14 1636    Subjective  S:  I have been really trying to use my arm alot.  It just wont do like I want it to.   Patient is accompained by: Family member   Currently in Pain? 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   Exercises   Exercises Neurological Re-education   Neurological Re-education Exercises   Scapular Stabilization  Seated;Left;10 reps   Other Information posterior facilitation from OT patient then leaning into therapist to increase the amount of elevation, after 10 repetitions, patient able to maintain position with shoulder with subluxation diminshed to less than 1/2 digit. completed shoulder retraction and extension 10 times with min faciliation for extension and mod for retraction.   Shoulder Flexion AAROM;10 reps   Shoulder ABduction AAROM;10 reps   Shoulder Protraction AAROM;10 reps   Shoulder External Rotation AAROM;10 reps   Shoulder Internal Rotation AAROM;10 reps   Elbow Flexion AAROM;10 reps   Elbow Extension AAROM;10 reps   Forearm Supination AAROM;10 reps   Forearm Pronation AAROM;10 reps   Wrist Flexion AAROM;10 reps   Wrist Extension PROM;10 reps   Finger Flexion active grasp this date 10 times, able to relax digits and on 2 occasions able to feel trace movement with digit extension   Development of Reach Hand over hand assistance   Hand over Hand Assistance while Reaching supine reaching with protraction, internal rotation and flexion and external rotation and flexion with min to mod facilitation, reaching with flexion to low target easiest for patient.  sate edge of mat to grasp a cone with min facilitation, reach forward using closed chain and min facilitation, drop cone with mod facilitation  and bring arm back with min facilitation  Patient greatly improved with this task by end of session nd compared to last session   Diagonal Patterns Sitting left arm right knee to left shoulder x 5 with mod facilitaiton left arm left knee to right shoulder 5 times    Diagonal Patterns Supine left arm right knee to left shoulder x 5 with mod facilitaiton   Manual Therapy   Manual Therapy Taping   Kinesiotex Facilitate Muscle  left deltoid                OT Education - 08/25/14 1643    Education provided Yes   Education Details applied kinesiotape to faciltate deltoid this date.  Educated  patient on contraindications, wearing schedule of tape   Person(s) Educated Patient   Methods Explanation   Comprehension Verbalized understanding          OT Short Term Goals - 08/18/14 1553    OT SHORT TERM GOAL #1   Title Patient will be educated on a HEP.   Time 6   Period Weeks   Status On-going   OT SHORT TERM GOAL #2   Title Patient will use her left arm as a gross assist with daily activites.   Time 6   Period Weeks   Status On-going   OT SHORT TERM GOAL #3   Title Patient will decrease subluxation in left shoulder to 1.5 digits for decreased pain with shoulder use.   Time 6   Period Weeks   Status On-going   OT SHORT TERM GOAL #4   Title Patient will wieghbear on her left arm with max pa while competing functional activiites.    Time 6   Period Weeks   Status On-going   OT SHORT TERM GOAL #5   Title Patient will have trace mobility in her LUE for increased ability to complete BADLs.   Time 6   Period Weeks   Status Achieved   OT SHORT TERM GOAL #6   Title Patient will complete bathing and dressing with close min guard.    Period Weeks   Status On-going   OT SHORT TERM GOAL #7   Title Patient will attend to left side during ADLs and functional ambulation 50% of time.    Period Weeks   Status On-going           OT Long Term Goals - 08/18/14 1554    OT LONG TERM GOAL #1   Title Patient will use her left arm as an active assist with daily tasks.   Time 12   Period Weeks   Status On-going   OT LONG TERM GOAL #2   Title Patient will have 50% volitional movement in her left arm.   Time 12   Period Weeks   Status On-going   OT LONG TERM GOAL #3   Title Patient will be able to weightbear with min pa or less.   Time 12   Period Weeks   Status On-going   OT LONG TERM GOAL #4   Title Patient will decrease subluxation to 1 digit or less in right shoulder region.    Time 12   Period Weeks   Status On-going   OT LONG TERM GOAL #5   Title Patient will  attend to left independently.   Time 12   Period Weeks   Status On-going   OT LONG TERM GOAL #6   Title Patient will improve to Brunnstrom stage IV in  LUE.   Time 12   Period Weeks   Status On-going   OT LONG TERM GOAL #7   Title Patient wll complete all ADLs and ambulation with Mod I.   Period Weeks   Status On-going               Plan - 08/25/14 1645    Clinical Impression Statement A:  Patient able to elevate and retract shoulder independently and maintain position for greater than 5 seconds, eliminating subluxation.  Improved volitional movement in LUE and improved control of movements.  Added PNF patterns for further development of movement.  Followed up on splint fit.     Plan P:  Follow up on kinesiotape.  Attempt weightbearing in standing while completing functional task.         Problem List Patient Active Problem List   Diagnosis Date Noted  . Seizures 12/02/2012  . AVM (arteriovenous malformation) 12/02/2012    Shirlean Mylar, OTR/L [phone]  08/25/2014, 4:48 PM  [facility] [address] [place], [place], [address] Phone: [phone]   Fax:  [phone]

## 2014-08-25 NOTE — Therapy (Signed)
[facility] [address] [place], [place], [address] Phone: [phone]   Fax:  [phone]  Physical Therapy Treatment  Patient Details  Name: Jennifer Pope MRN: [id number] Date of Birth: Feb 11, 1971 Referring Provider:  Derrell Lollingrenstein, Raphael, MD  Encounter Date: 08/25/2014      PT End of Session - 08/25/14 1548    Visit Number 2   Date for PT Re-Evaluation 09/09/14   Authorization Type [employer]   Authorization - Visit Number 2   Authorization - Number of Visits 16   PT Start Time 1350   PT Stop Time 1435   PT Time Calculation (min) 45 min   Equipment Utilized During Treatment Gait belt;Other (comment)   Activity Tolerance Patient tolerated treatment well   Behavior [facility] Therapy [facility] for tasks assessed/performed      Past Medical History  Diagnosis Date  . Seizures   . Arteriovenous malformation   . Stroke     Past Surgical History  Procedure Laterality Date  . Ankle fracture surgery Right     2002    There were no vitals filed for this visit.  Visit Diagnosis:  Spastic hemiplegia affecting nondominant side  Weakness due to cerebrovascular accident  Decreased functional mobility  Left leg weakness  Difficulty walking  Abnormality of gait      Subjective Assessment - 08/25/14 1528    Subjective Reports that they have really been working hard on therapy at home. Husband has taken some time off work to help with rehabilitation process. 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 Patient and husband admit to patient doing some walking around the house without device.    Patient is accompained by: Family member   Currently in Pain? No/denies                         [facility] - 08/25/14 0001    Ambulation/Gait   Ambulation/Gait Yes   Ambulation/Gait Assistance 4: Min assist   Ambulation/Gait Assistance Details balance  assist as needed, cues for even stride length bilaterally and assist as needed for neutral hip rotation with initial contact to stance phase   Ambulation Distance (Feet) 125 Feet  50X2, 75X1   Assistive device Hemi-walker   Gait Pattern Step-to pattern;Step-through pattern;Left circumduction  starting step to - step through by end of session   Ambulation Surface Level   Gait Comments Patient able to ambulate short distnce without device but poor pattern. Focus on qjuality of pattern. Poor dorsiflexion despite AFO, tendency for Lt hip external rotation with contact. Intermittant Lt knee hyperextension, cues for soft knee withstance.    Posture/Postural Control   Posture/Postural Control --  cues for upright posture during session, verbal/tactile   Dynamic Standing Balance   Dynamic Standing - Level of Assistance 4: Min assist   Dynamic Standing - Balance Activities Lateral lean/weight shifting;Forward lean/weight shifting  Forward diagonal Lt forward hip strategy vs shoulders   Knee/Hip Exercises: Stretches   Gastroc Stretch 3 reps;30 seconds  educated husband and patient to perform at home.   Ambulation   Ambulation/Gait Assistance Details Tactile cues for weight shifting;Tactile cues for sequencing;Tactile cues for posture;Tactile cues for weight beaing;Manual facilitation for weight shifting;Manual facilitation for placement;Manual facilitation for weight bearing                PT Education - 08/25/14 1546    Education provided Yes  Education Details Patient and husband educated on dorsiflexion stretch, standing weight shifts from pelvis and staggered stance weight shifts. "shift and glide" cue.    Person(s) Educated Patient;Spouse   Methods Explanation;Demonstration;Tactile cues;Verbal cues   Comprehension Verbalized understanding;Need further instruction          PT Short Term Goals - 08/10/14 2005    PT SHORT TERM GOAL #1   Title Patient will demosntrate increased  ankel dorsiflexion strength of 2/5 MMT to decrease risk of trippign over Lt foot due to Lt foot drop.   Time 4   Period Weeks   Status New   PT SHORT TERM GOAL #2   Title Patient will demonstrate increased Lt hamstring/calf strength of 4-/5 MMT to decrease risk of Lt knee hyper extension.    Time 4   Period Weeks   Status New   PT SHORT TERM GOAL #3   Title Patient will dmeosntrate increased Lt hip flexion of 4/5 MMT to be able to ambulate with improved stride length.    Time 4   Period Weeks   Status New   PT SHORT TERM GOAL #4   Title patient will be abel to ambualte with a SPC for 100 ft   Time 4   Period Weeks   Status New   PT SHORT TERM GOAL #5   Title I with HEP for progression of strength   Time 4   Period Weeks   Status New           PT Long Term Goals - 08/10/14 2009    PT LONG TERM GOAL #1   Title Patient will demonstrate increased ankle dorsiflexion strength of 3/5 MMT to decrease risk of tripping over Lt foot due to Lt foot drop   Time 8   Period Weeks   Status New   PT LONG TERM GOAL #2   Title Patient will demonstrate increased Lt hamstring/calf strength of 5/5 MMT to decrease risk of Lt knee hyper extension.    Time 8   Period Weeks   Status New   PT LONG TERM GOAL #3   Title Patient will demosntrate increased Lt hip extension of 4/5 MMT to be able to ambulate with improved stride length and perofmr sit to stand without dependence of Rt LE for perofrmance of sit to stand.    Time 8   Period Weeks   Status New   PT LONG TERM GOAL #4   Title Patient will be able to ambulate up and down stairs with only 1 HHA.    Time 8   Period Weeks   Status New   PT LONG TERM GOAL #5   Title Patient will demonstrate increased abdominal strength of 5/5 MMT to be able ride horses.    Time 8   Period Weeks   Status New               Problem List Patient Active Problem List   Diagnosis Date Noted  . Seizures 12/02/2012  . AVM (arteriovenous  malformation) 12/02/2012    Delton See, PT, CSCS  08/25/2014, 3:51 PM  [facility] [address] [place], [place], [address] Phone: [phone]   Fax:  [phone]

## 2014-08-26 ENCOUNTER — Ambulatory Visit: Payer: Federal, State, Local not specified - PPO

## 2014-08-26 ENCOUNTER — Encounter: Payer: Federal, State, Local not specified - PPO | Admitting: Occupational Therapy

## 2014-08-27 ENCOUNTER — Ambulatory Visit (HOSPITAL_COMMUNITY): Payer: Federal, State, Local not specified - PPO

## 2014-08-27 ENCOUNTER — Encounter (HOSPITAL_COMMUNITY): Payer: Self-pay

## 2014-08-27 DIAGNOSIS — I69119 Unspecified symptoms and signs involving cognitive functions following nontraumatic intracerebral hemorrhage: Secondary | ICD-10-CM

## 2014-08-27 DIAGNOSIS — R262 Difficulty in walking, not elsewhere classified: Secondary | ICD-10-CM

## 2014-08-27 DIAGNOSIS — IMO0002 Reserved for concepts with insufficient information to code with codable children: Secondary | ICD-10-CM

## 2014-08-27 DIAGNOSIS — R269 Unspecified abnormalities of gait and mobility: Secondary | ICD-10-CM

## 2014-08-27 DIAGNOSIS — G8110 Spastic hemiplegia affecting unspecified side: Secondary | ICD-10-CM

## 2014-08-27 DIAGNOSIS — I69354 Hemiplegia and hemiparesis following cerebral infarction affecting left non-dominant side: Secondary | ICD-10-CM | POA: Diagnosis not present

## 2014-08-27 DIAGNOSIS — R2689 Other abnormalities of gait and mobility: Secondary | ICD-10-CM

## 2014-08-27 DIAGNOSIS — R29898 Other symptoms and signs involving the musculoskeletal system: Secondary | ICD-10-CM

## 2014-08-27 DIAGNOSIS — S43112S Subluxation of left acromioclavicular joint, sequela: Secondary | ICD-10-CM

## 2014-08-27 NOTE — Therapy (Signed)
[facility] [address] [place], [place], [address] Phone: [phone]   Fax:  [phone]  Occupational Therapy Treatment  Patient Details  Name: Jennifer Pope MRN: [id number] Date of Birth: [birth year] Referring Provider:  Derrell Lollingrenstein, Raphael, MD  Encounter Date: 08/27/2014      OT End of Session - 08/27/14 1507    Visit Number 6   Number of Visits 36   Date for OT Re-Evaluation 10/10/14  mini reassess 6/15   Authorization Type n/a   OT Start Time 1303   OT Stop Time 1345   OT Time Calculation (min) 42 min   Activity Tolerance Patient tolerated treatment well   Behavior During Therapy [facility] for tasks assessed/performed      Past Medical History  Diagnosis Date  . Seizures   . Arteriovenous malformation   . Stroke     Past Surgical History  Procedure Laterality Date  . Ankle fracture surgery Right     2002    There were no vitals filed for this visit.  Visit Diagnosis:  Subluxation of left acromioclavicular joint, sequela  Weakness due to cerebrovascular accident      Subjective Assessment - 08/27/14 1327    Subjective  S: I wear it because my shoulder hurts a lot if I don't because of the subluxation.    Currently in Pain? 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   Shoulder Exercises: Isometric Strengthening   Flexion 3X3"  seated; therapist provided support to eliminate subluxation   Extension 3X3"   seated   Internal Rotation 3X3"  seated   Neurological Re-education Exercises   Elbow Extension --  Isometric; 3X3; seated   Wrist Extension --  Isometric; 3X3 with tapping for facilitation   Finger Flexion Active gross grasp 5 reps   Finger Extension PROM composite extension; 5 reps   Other Exercises 1 Elbow flexion; 3X3; seated   Modalities   Modalities Electrical Stimulation   Electrical Stimulation   Electrical Stimulation Location 1) Anterior shoulder (upper and lower trapezius) 2) medial deltoid and upper trapezius   Electrical Stimulation Action Russian   Electrical Stimulation Parameters 1) 27 mA CC 2)  21 mA CC   Electrical Stimulation Goals Neuromuscular facilitation                OT Education - 08/27/14 1505    Education provided Yes   Education Details Recommended patient not wear sling unless she is standing or walking for shoulder support. Educated patient on proper positioning of left arm when out of the sling. Pros: increase awareness of left side, sensory input, nueromuscular input.    Person(s) Educated Patient   Methods Explanation   Comprehension Verbalized understanding          OT Short Term Goals - 08/18/14 1553    OT SHORT TERM GOAL #1   Title Patient will  be educated on a HEP.   Time 6   Period Weeks   Status On-going   OT SHORT TERM GOAL #2   Title Patient will use her left arm as a gross assist with daily activites.   Time 6   Period Weeks   Status On-going   OT SHORT TERM GOAL #3   Title Patient will decrease subluxation in left shoulder to 1.5 digits for decreased pain with shoulder use.   Time 6   Period Weeks   Status On-going   OT SHORT TERM GOAL #4   Title Patient will wieghbear on her left arm with max pa while competing functional activiites.    Time 6   Period Weeks   Status On-going   OT SHORT TERM GOAL #5   Title Patient will have trace mobility in her LUE for increased ability to complete BADLs.   Time 6    Period Weeks   Status Achieved   OT SHORT TERM GOAL #6   Title Patient will complete bathing and dressing with close min guard.    Period Weeks   Status On-going   OT SHORT TERM GOAL #7   Title Patient will attend to left side during ADLs and functional ambulation 50% of time.    Period Weeks   Status On-going           OT Long Term Goals - 08/18/14 1554    OT LONG TERM GOAL #1   Title Patient will use her left arm as an active assist with daily tasks.   Time 12   Period Weeks   Status On-going   OT LONG TERM GOAL #2   Title Patient will have 50% volitional movement in her left arm.   Time 12   Period Weeks   Status On-going   OT LONG TERM GOAL #3   Title Patient will be able to weightbear with min pa or less.   Time 12   Period Weeks   Status On-going   OT LONG TERM GOAL #4   Title Patient will decrease subluxation to 1 digit or less in right shoulder region.    Time 12   Period Weeks   Status On-going   OT LONG TERM GOAL #5   Title Patient will attend to left independently.   Time 12   Period Weeks   Status On-going   OT LONG TERM GOAL #6   Title Patient will improve to Brunnstrom stage IV in LUE.   Time 12   Period Weeks   Status On-going   OT LONG TERM GOAL #7   Title Patient wll complete all ADLs and ambulation with Mod I.   Period Weeks   Status On-going               Plan - 08/27/14 1507    Clinical Impression Statement A: Patient presented with 1/5 muscle grade during seated shoulder flexion, elevation, IR, abduction, and elbow flexion/extension while isometric exercises with therapist. Therapist provided tapping to left wrist extensors this session in which faciliated a muscle twitch  during wrist extension.    Plan P: Complete weightbearing activity while standing during functional task.         Problem List Patient Active Problem List   Diagnosis Date Noted  . Seizures 12/02/2012  . AVM (arteriovenous malformation) 12/02/2012     Limmie Patricia, OTR/L,CBIS  [phone]  08/27/2014, 3:17 PM  [facility] [address] [place],  [place], [address] Phone: [phone]   Fax:  [phone]

## 2014-08-27 NOTE — Therapy (Signed)
[facility] [address] [place], [place], [address] Phone: [phone]   Fax:  [phone]  Physical Therapy Treatment  Patient Details  Name: Jennifer Pope MRN: [id number] Date of Birth: 03/07/1971 Referring Provider:  Cassell Clement, MD  Encounter Date: 08/27/2014      PT End of Session - 08/27/14 1629    Visit Number 3   Number of Visits 16   Date for PT Re-Evaluation 09/09/14   Authorization Type [employer]   Authorization - Visit Number 3   Authorization - Number of Visits 16   PT Start Time 1345   PT Stop Time 1430   PT Time Calculation (min) 45 min   Equipment Utilized During Treatment Gait belt;Other (comment)   Activity Tolerance Patient tolerated treatment well   Behavior During Therapy [facility] for tasks assessed/performed      Past Medical History  Diagnosis Date  . Seizures   . Arteriovenous malformation   . Stroke     Past Surgical History  Procedure Laterality Date  . Ankle fracture surgery Right     2002    There were no vitals filed for this visit.  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 Still no updates on cranial flap replacements. Pt is intereste din progressing to cane eventually.    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 Lt AFO for prevention of knee yper extension an dprevention of Lt foot drop. pain its predominantly in shoulder secondary to Lt shoulder subluxation for which patient will be seeing OT. patient arriveswwearing helmet for protection of brain flap.    How long can you sit comfortably? No problems    How long can you stand comfortably? 1 hours.    How long can you walk comfortably? patien table to ambualte .25 miles, maybe les on verying terrain.    Patient Stated Goals Patinet want to be able to walk without a walker, wants to improve balance. horse back riding.    Currently in Pain? No/denies                         [facility] Adult PT Treatment/Exercise - 08/27/14 1618    Ambulation/Gait   Ambulation/Gait Yes   Ambulation/Gait Assistance 4: Min guard  1 LOB able to self correct   Ambulation Distance (Feet) 450 Feet  150x3 c hemi walker   Assistive device Hemi-walker   Ambulation Surface Level;Unlevel  utilized foam mat    Gait Comments Additional work with L side stepping to improve RLE abduction and external rotation. Gave pt cues on utilizing hemi walker at 45degree angle rather than to the side, which she said felt more stable.    Knee/Hip Exercises: Stretches   Passive Hamstring Stretch 3 reps;30 seconds  bilat, taught new position in sitting edge of chair.    Gastroc Stretch 3 reps;30 seconds   Knee/Hip Exercises: Aerobic   Stationary Bike NuStep  10 minutes, tacit LUE, level 1   Ankle Exercises: Seated   Other Seated Ankle Exercises AAROM L dorsiflexion  3x10  PT Short Term Goals - 08/10/14 2005    PT SHORT TERM GOAL #1   Title Patient will demosntrate increased ankel dorsiflexion strength of 2/5 MMT to decrease risk of trippign over Lt foot due to Lt foot drop.   Time 4   Period Weeks   Status New   PT SHORT TERM GOAL #2   Title Patient will demonstrate increased Lt hamstring/calf strength of 4-/5 MMT to decrease risk of Lt knee hyper extension.    Time 4   Period Weeks   Status New   PT SHORT TERM GOAL #3   Title Patient will dmeosntrate increased Lt hip flexion of 4/5 MMT to be able to ambulate with improved stride length.    Time 4   Period Weeks   Status New   PT SHORT TERM GOAL #4   Title patient will be abel to  ambualte with a SPC for 100 ft   Time 4   Period Weeks   Status New   PT SHORT TERM GOAL #5   Title I with HEP for progression of strength   Time 4   Period Weeks   Status New           PT Long Term Goals - 08/10/14 2009    PT LONG TERM GOAL #1   Title Patient will demonstrate increased ankle dorsiflexion strength of 3/5 MMT to decrease risk of tripping over Lt foot due to Lt foot drop   Time 8   Period Weeks   Status New   PT LONG TERM GOAL #2   Title Patient will demonstrate increased Lt hamstring/calf strength of 5/5 MMT to decrease risk of Lt knee hyper extension.    Time 8   Period Weeks   Status New   PT LONG TERM GOAL #3   Title Patient will demosntrate increased Lt hip extension of 4/5 MMT to be able to ambulate with improved stride length and perofmr sit to stand without dependence of Rt LE for perofrmance of sit to stand.    Time 8   Period Weeks   Status New   PT LONG TERM GOAL #4   Title Patient will be able to ambulate up and down stairs with only 1 HHA.    Time 8   Period Weeks   Status New   PT LONG TERM GOAL #5   Title Patient will demonstrate increased abdominal strength of 5/5 MMT to be able ride horses.    Time 8   Period Weeks   Status New               Plan - 08/27/14 1630    Clinical Impression Statement Pt making moderate progress in ambulation, as evdienced by improved motor control and decreased need for cues to correct LLE during gait. Pt demonstrates safer use of AD during gait training today, as well as imcreased distance in AMB.  Greatest limitation remains weakness in L ankle dorsiflexors and ability to control hip rotataion. Pt presenting with impairment of strength, posture, balance, and motor control, limiting functional indep in ADL, IADL, and ambulation and will contiue to benefit from skilled intervention to restore to PLOF.    Pt will benefit from skilled therapeutic intervention in order to improve on the following deficits  Abnormal gait;Decreased strength;Difficulty walking;Decreased activity tolerance;Decreased balance;Impaired flexibility;Decreased endurance   Rehab Potential Good   PT Frequency 2x / week   PT Duration 8 weeks   PT Treatment/Interventions Therapeutic exercise;Balance training;Neuromuscular re-education;Patient/family education;Gait  training;Manual techniques;Functional mobility training;Therapeutic activities   PT Next Visit Plan introduce LE strengthening Exercises: Bridges, Total gym squats, and box lunges   PT Home Exercise Plan updated HS stretch to remove excessive lumbar flexion.    Consulted and Agree with Plan of Care Patient        Problem List Patient Active Problem List   Diagnosis Date Noted  . Seizures 12/02/2012  . AVM (arteriovenous malformation) 12/02/2012    Dashanna Kinnamon C 08/27/2014, 4:41 PM  4:41 PM  Rosamaria LintsAllan C Kaenan Jake, PT, DPT [place] License # [id number]     [facility] [address] [place], [place], [address] Phone: [phone]   Fax:  [phone]

## 2014-08-30 ENCOUNTER — Ambulatory Visit (HOSPITAL_COMMUNITY): Payer: Federal, State, Local not specified - PPO | Admitting: Physical Therapy

## 2014-08-30 ENCOUNTER — Ambulatory Visit (HOSPITAL_COMMUNITY): Payer: Federal, State, Local not specified - PPO

## 2014-08-30 ENCOUNTER — Encounter (HOSPITAL_COMMUNITY): Payer: Self-pay

## 2014-08-30 ENCOUNTER — Encounter (HOSPITAL_COMMUNITY): Payer: Federal, State, Local not specified - PPO | Admitting: Speech Pathology

## 2014-08-30 DIAGNOSIS — I69354 Hemiplegia and hemiparesis following cerebral infarction affecting left non-dominant side: Secondary | ICD-10-CM | POA: Diagnosis not present

## 2014-08-30 DIAGNOSIS — R262 Difficulty in walking, not elsewhere classified: Secondary | ICD-10-CM

## 2014-08-30 DIAGNOSIS — G8110 Spastic hemiplegia affecting unspecified side: Secondary | ICD-10-CM

## 2014-08-30 DIAGNOSIS — IMO0002 Reserved for concepts with insufficient information to code with codable children: Secondary | ICD-10-CM

## 2014-08-30 DIAGNOSIS — R29898 Other symptoms and signs involving the musculoskeletal system: Secondary | ICD-10-CM

## 2014-08-30 DIAGNOSIS — R269 Unspecified abnormalities of gait and mobility: Secondary | ICD-10-CM

## 2014-08-30 DIAGNOSIS — R2689 Other abnormalities of gait and mobility: Secondary | ICD-10-CM

## 2014-08-30 NOTE — Therapy (Signed)
[facility] [address] [place], [place], [address] Phone: [phone]   Fax:  [phone]  Occupational Therapy Treatment  Patient Details  Name: Jennifer Pope MRN: [id number] Date of Birth: Jul 03, 1970 Referring Provider:  Derrell Lollingrenstein, Raphael, MD  Encounter Date: 08/30/2014      OT End of Session - 08/30/14 1304    Visit Number 7   Number of Visits 36   Date for OT Re-Evaluation 10/10/14  mini reassess 6/15   Authorization Type n/a   OT Start Time 1018   OT Stop Time 1100   OT Time Calculation (min) 42 min   Activity Tolerance Patient tolerated treatment well   Behavior During Therapy [facility] for tasks assessed/performed      Past Medical History  Diagnosis Date  . Seizures   . Arteriovenous malformation   . Stroke     Past Surgical History  Procedure Laterality Date  . Ankle fracture surgery Right     2002    There were no vitals filed for this visit.  Visit Diagnosis:  Weakness due to cerebrovascular accident      Subjective Assessment - 08/30/14 1247    Subjective  S: I've been trying to not wear the sling like you said. Instead I've been just hooking my hand into my pants.    Currently in Pain? 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   Precautions   Precautions Fall;Other (comment)   Precaution Comments left side neglect                  OT Treatments/Exercises (OP) - 08/30/14 1248    Bed Mobility   Bed Mobility Rolling Right;Supine to Sit;Sit to Supine   Rolling Right 6: Modified independent (Device/Increase time)   Supine to Sit 6: Modified independent (Device/Increase time)   Sit to Supine 6: Modified independent (Device/Increase time)   Exercises   Exercises Neurological Re-education;Shoulder;Elbow;Wrist;Hand   Shoulder Exercises: Sidelying   Flexion AAROM;5 reps;Limitations  2 sets   Flexion Limitations Pt used powder board. Able to achieve shoulder flexion to shoulder level before requiring assist from therapist for remaining range.    Other Sidelying Exercises Protraction; 2 sets; 5 reps; using powder board; therapist tapped bicep and tricep to facilitate muscle contraction. Pt able to complete motion with min assist from therapist to maintain proper positioning of UE throughout movement.    Hand Exercises   Other Hand Exercises Pt completed finger flexion and extension with NMES assisting with movement. vc's to give 100% effort with each movement. pt had great results.    Neurological Re-education Exercises   Weight Bearing Position Standing   Standing with weight shifting on and off While standing at table; left hand was placed on blue balance square, box of pegs on left side and large pegboard was on incline in front of patient. Patient completed weightbearing to LUE when reaching for pegs before placing in pegboard. Therapist provided mod assist to prevent buckling of elbow during weightbearing.   Modalities   Modalities Insurance account managerlectrical Stimulation   Electrical Stimulation   Electrical Stimulation Location left wrist extensors   Heritage managerlectrical Stimulation Action Russian   Electrical Stimulation Parameters 34 mA CC   Electrical Stimulation Goals Neuromuscular facilitation   Weight Bearing Technique   Weight Bearing Technique Yes  OT Short Term Goals - 08/18/14 1553    OT SHORT TERM GOAL #1   Title Patient will be educated on a HEP.   Time 6   Period Weeks   Status On-going   OT SHORT TERM GOAL #2   Title Patient will use her left arm as a gross assist with daily activites.   Time 6   Period Weeks   Status On-going   OT SHORT TERM GOAL #3   Title Patient will decrease subluxation in left shoulder to 1.5 digits for decreased pain with shoulder use.   Time 6   Period Weeks   Status On-going   OT SHORT TERM GOAL #4   Title Patient will  wieghbear on her left arm with max pa while competing functional activiites.    Time 6   Period Weeks   Status On-going   OT SHORT TERM GOAL #5   Title Patient will have trace mobility in her LUE for increased ability to complete BADLs.   Time 6   Period Weeks   Status Achieved   OT SHORT TERM GOAL #6   Title Patient will complete bathing and dressing with close min guard.    Period Weeks   Status On-going   OT SHORT TERM GOAL #7   Title Patient will attend to left side during ADLs and functional ambulation 50% of time.    Period Weeks   Status On-going           OT Long Term Goals - 08/18/14 1554    OT LONG TERM GOAL #1   Title Patient will use her left arm as an active assist with daily tasks.   Time 12   Period Weeks   Status On-going   OT LONG TERM GOAL #2   Title Patient will have 50% volitional movement in her left arm.   Time 12   Period Weeks   Status On-going   OT LONG TERM GOAL #3   Title Patient will be able to weightbear with min pa or less.   Time 12   Period Weeks   Status On-going   OT LONG TERM GOAL #4   Title Patient will decrease subluxation to 1 digit or less in right shoulder region.    Time 12   Period Weeks   Status On-going   OT LONG TERM GOAL #5   Title Patient will attend to left independently.   Time 12   Period Weeks   Status On-going   OT LONG TERM GOAL #6   Title Patient will improve to Brunnstrom stage IV in LUE.   Time 12   Period Weeks   Status On-going   OT LONG TERM GOAL #7   Title Patient wll complete all ADLs and ambulation with Mod I.   Period Weeks   Status On-going               Plan - 08/30/14 1304    Clinical Impression Statement A: Husband present during tx session. Pt without sling on this session stating that she was trying to go without as we had discussed at previous session. Recommended that patient wear it during ambulation to provide support to LUE. With patient starting to see active movement in her  LUE it is a good idea to not wear it as much to increase awareness  to left side and to promote use.   Plan P: Follow up on sling that was ordered (Pt was wearing a small/medium which  was not appropriate. Had Mandy order a Large/XL). Use theraputty during weightbearing task, attempt grip using putty. COnt with powder board.        Problem List Patient Active Problem List   Diagnosis Date Noted  . Seizures 12/02/2012  . AVM (arteriovenous malformation) 12/02/2012    Limmie Patricia, OTR/L,CBIS  [phone]  08/30/2014, 3:08 PM  [place] [facility] [address] [place], [place], [address] Phone: [phone]   Fax:  [phone]

## 2014-08-30 NOTE — Therapy (Signed)
[place] [facility] [address] [place], [place], [address] Phone: [phone]   Fax:  [phone]  Physical Therapy Treatment  Patient Details  Name: Jennifer Pope MRN: [id number] Date of Birth: April 02, 1970 Referring Provider:  Derrell Lolling, MD  Encounter Date: 08/30/2014      PT End of Session - 08/30/14 1202    Visit Number 4   Number of Visits 16   Date for PT Re-Evaluation 09/09/14   Authorization Type BCBS   Authorization - Visit Number 4   Authorization - Number of Visits 16   PT Start Time 1100   PT Stop Time 1145   PT Time Calculation (min) 45 min   Equipment Utilized During Treatment Gait belt   Activity Tolerance Patient tolerated treatment well   Behavior During Therapy [facility] for tasks assessed/performed      Past Medical History  Diagnosis Date  . Seizures   . Arteriovenous malformation   . Stroke     Past Surgical History  Procedure Laterality Date  . Ankle fracture surgery Right     2002    There were no vitals filed for this visit.  Visit Diagnosis:  Weakness due to cerebrovascular accident  Spastic hemiplegia affecting nondominant side  Decreased functional mobility  Left leg weakness  Difficulty walking  Abnormality of gait      Subjective Assessment - 08/30/14 1149    Subjective Doing a little better wth the walking, still working on exercises at home. Wanting to be able to get to the point where she can go out an be with her horses again.    Currently in Pain? No/denies                         [facility] Adult PT Treatment/Exercise - 08/30/14 0001    Bed Mobility   Bed Mobility Rolling Right;Rolling Left   Rolling Right 4: Min assist   Rolling Left 4: Min assist   Ambulation/Gait   Ambulation/Gait Yes   Ambulation/Gait Assistance 4: Min guard   Ambulation Distance (Feet) 350 Feet   Assistive device Hemi-walker   Gait Pattern Step-to pattern;Step-through pattern   Gait Comments 200 feet X1, 150 feet X1. Cues for even stride length with decreasing Lt stride and step through on Right. Knee flexion cues for swing phase. Noted scuffing Lt toe and hip external rotation with hip flexion.    Knee/Hip Exercises: Standing   Knee Flexion Strengthening;Left;1 set;5 reps  max assist required.   Knee Flexion Limitations modified to prone   Knee/Hip Exercises: Supine   Heel Slides Strengthening;Left;1 set;10 reps   Heel Slides Limitations verbal/tactile cues for adduction with slide.    Other Supine Knee Exercises hooklying ball squeeze with emphasis on Lt. activation.   Knee/Hip Exercises: Prone   Hamstring Curl 1 set;10 reps   Hamstring Curl Limitations tactile/verbal cues for hamstring activation                PT Education - 08/30/14 1159    Education provided Yes   Education Details To work on prone knee flexion, discussed Lt. arm positioning for shoulder safety. Supine heel slides with neutral Lt. lower extremity alignment. Related exercies to gait and goals for mobility.    Person(s) Educated Patient;Spouse   Methods Explanation;Demonstration;Tactile cues;Verbal cues   Comprehension Verbalized understanding;Need further instruction;Tactile cues required;Verbal cues required          PT Short Term Goals - 08/10/14 2005  PT SHORT TERM GOAL #1   Title Patient will demosntrate increased ankel dorsiflexion strength of 2/5 MMT to decrease risk of trippign over Lt foot due to Lt foot drop.   Time 4   Period Weeks   Status New   PT SHORT TERM GOAL #2   Title Patient will demonstrate increased Lt hamstring/calf strength of 4-/5 MMT to decrease risk of Lt knee hyper extension.    Time 4   Period Weeks   Status New   PT SHORT TERM GOAL #3   Title Patient will dmeosntrate increased Lt hip flexion of 4/5 MMT to be able to ambulate with improved stride length.    Time 4   Period Weeks   Status New   PT SHORT TERM GOAL #4   Title patient will be  abel to ambualte with a SPC for 100 ft   Time 4   Period Weeks   Status New   PT SHORT TERM GOAL #5   Title I with HEP for progression of strength   Time 4   Period Weeks   Status New           PT Long Term Goals - 08/10/14 2009    PT LONG TERM GOAL #1   Title Patient will demonstrate increased ankle dorsiflexion strength of 3/5 MMT to decrease risk of tripping over Lt foot due to Lt foot drop   Time 8   Period Weeks   Status New   PT LONG TERM GOAL #2   Title Patient will demonstrate increased Lt hamstring/calf strength of 5/5 MMT to decrease risk of Lt knee hyper extension.    Time 8   Period Weeks   Status New   PT LONG TERM GOAL #3   Title Patient will demosntrate increased Lt hip extension of 4/5 MMT to be able to ambulate with improved stride length and perofmr sit to stand without dependence of Rt LE for perofrmance of sit to stand.    Time 8   Period Weeks   Status New   PT LONG TERM GOAL #4   Title Patient will be able to ambulate up and down stairs with only 1 HHA.    Time 8   Period Weeks   Status New   PT LONG TERM GOAL #5   Title Patient will demonstrate increased abdominal strength of 5/5 MMT to be able ride horses.    Time 8   Period Weeks   Status New               Plan - 08/30/14 1203    Clinical Impression Statement Patients goal remains focused on ambulation regarding physical therapy at this time. Gradual improvements with gait noted with step through pattern but continued difficulty remains with foot clearance with swing phase along with maintaining neutral hip rotation with hip flexion. Breaking down gait pattern deficits into exercies patient can perform at home to contribute to gait cycle improvements.    Pt will benefit from skilled therapeutic intervention in order to improve on the following deficits Abnormal gait;Decreased strength;Difficulty walking;Decreased activity tolerance;Decreased balance;Impaired flexibility;Decreased endurance    Rehab Potential Good   PT Frequency 2x / week   PT Duration 8 weeks   PT Treatment/Interventions Therapeutic exercise;Balance training;Neuromuscular re-education;Patient/family education;Gait training;Manual techniques;Functional mobility training;Therapeutic activities   PT Next Visit Plan Check prone and supine exercies and impace on gait cycle. continue to work on neutral hip rotation with swing phase and hamstring activation for clearance with  swing phase. Continue with modification to exercise program to best address gait deficits. consider high kneeling for hip and core stabiliization exercies.    PT Home Exercise Plan Check hip flexion and prone knee flexion. May be able to try standing Lt leg swing phase repeats if appropreate.    Consulted and Agree with Plan of Care Patient        Problem List Patient Active Problem List   Diagnosis Date Noted  . Seizures 12/02/2012  . AVM (arteriovenous malformation) 12/02/2012    Christiane Ha, PT, CSCS  08/30/2014, 12:13 PM  [place] [facility] [address] [place], [place], [address] Phone: [phone]   Fax:  [phone]

## 2014-09-01 ENCOUNTER — Encounter (HOSPITAL_COMMUNITY): Payer: Self-pay | Admitting: Occupational Therapy

## 2014-09-01 ENCOUNTER — Ambulatory Visit (HOSPITAL_COMMUNITY): Payer: Federal, State, Local not specified - PPO | Admitting: Occupational Therapy

## 2014-09-01 ENCOUNTER — Ambulatory Visit (HOSPITAL_COMMUNITY): Payer: Federal, State, Local not specified - PPO | Admitting: Physical Therapy

## 2014-09-01 DIAGNOSIS — R262 Difficulty in walking, not elsewhere classified: Secondary | ICD-10-CM

## 2014-09-01 DIAGNOSIS — IMO0002 Reserved for concepts with insufficient information to code with codable children: Secondary | ICD-10-CM

## 2014-09-01 DIAGNOSIS — R2689 Other abnormalities of gait and mobility: Secondary | ICD-10-CM

## 2014-09-01 DIAGNOSIS — S43112S Subluxation of left acromioclavicular joint, sequela: Secondary | ICD-10-CM

## 2014-09-01 DIAGNOSIS — R269 Unspecified abnormalities of gait and mobility: Secondary | ICD-10-CM

## 2014-09-01 DIAGNOSIS — G8110 Spastic hemiplegia affecting unspecified side: Secondary | ICD-10-CM

## 2014-09-01 DIAGNOSIS — I69354 Hemiplegia and hemiparesis following cerebral infarction affecting left non-dominant side: Secondary | ICD-10-CM | POA: Diagnosis not present

## 2014-09-01 DIAGNOSIS — R29898 Other symptoms and signs involving the musculoskeletal system: Secondary | ICD-10-CM

## 2014-09-01 NOTE — Therapy (Signed)
[place] [facility] [address] [place], [place], [address] Phone: [phone]   Fax:  [phone]  Occupational Therapy Treatment  Patient Details  Name: Jennifer Pope MRN: [id number] Date of Birth: September 01, 1970 Referring Provider:  Cassell ClementKnowles-Jonas, Lynde, MD  Encounter Date: 09/01/2014      OT End of Session - 09/01/14 1638    Visit Number 8   Number of Visits 36   Date for OT Re-Evaluation 10/10/14  mini reassess 6/15   Authorization Type n/a   OT Start Time 1350   OT Stop Time 1430   OT Time Calculation (min) 40 min   Activity Tolerance Patient tolerated treatment well   Behavior During Therapy [facility] for tasks assessed/performed      Past Medical History  Diagnosis Date  . Seizures   . Arteriovenous malformation   . Stroke     Past Surgical History  Procedure Laterality Date  . Ankle fracture surgery Right     2002    There were no vitals filed for this visit.  Visit Diagnosis:  Weakness due to cerebrovascular accident  Spastic hemiplegia affecting nondominant side  Subluxation of left acromioclavicular joint, sequela      Subjective Assessment - 09/01/14 1624    Subjective  S: I've been doing exercises at my kitchen table.    Currently in Pain? 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   Precautions   Precautions Fall;Other (comment)  Scalp flap-helmet on at all times when ambulating   Precaution Comments left side neglect                  OT Treatments/Exercises (OP) - 09/01/14 1625    Bed Mobility   Bed Mobility Rolling Right;Supine to Sit;Sit to Supine   Rolling Right 4: Min assist  with positioning LLE   Supine to Sit 6: Modified independent (Device/Increase time)   Sit to Supine 6: Modified independent (Device/Increase time)   Exercises   Exercises Neurological  Re-education;Shoulder;Elbow;Wrist;Hand   Shoulder Exercises: Sidelying   Flexion AAROM;10 reps   Flexion Limitations Pt used powder board. Able to achieve shoulder flexion to shoulder level before requiring assist from therapist for remaining range. Pt able to bring LUE back down with no assistance from OTR    Other Sidelying Exercises Protraction; 2 sets; 5 reps; using powder board. Pt had good bicep contraction, no tapping/facilitation required this session. Pt required min-mod facilitation for tricep extension. Pt able to complete motion with min assist from therapist to maintain proper positioning of UE throughout movement.    Neurological Re-education Exercises   Scapular Stabilization Seated   Other Information Completed shoulder elevation with anterior and posterior facilitation from OT, 10 repetitons. Shoulder retraction completed with mod facilitation from OT.    Shoulder ABduction AAROM;10 reps   Shoulder External Rotation AAROM;10 reps   Shoulder Internal Rotation AAROM;10 reps   Finger Extension PROM composite extension 2 sets of 5 reps   Weight Bearing Position Standing   Standing with weight shifting on and off While standing at table, left hand placed on yellow theraputty, pt completed weightbearing to LUE shifting on & off LUE to flatten theraputty. Pt used right hand to support wrist, OT provided mod assist to prevent elbow from buckling   Diagonal Patterns Sitting LUE: right knee to left shoulder, left knee to  right shoulder; 5 reps each; mod facilitation from OT. Pt had increased bicep contraction & control during left knee to right shoulder.                   OT Short Term Goals - 08/18/14 1553    OT SHORT TERM GOAL #1   Title Patient will be educated on a HEP.   Time 6   Period Weeks   Status On-going   OT SHORT TERM GOAL #2   Title Patient will use her left arm as a gross assist with daily activites.   Time 6   Period Weeks   Status On-going   OT SHORT  TERM GOAL #3   Title Patient will decrease subluxation in left shoulder to 1.5 digits for decreased pain with shoulder use.   Time 6   Period Weeks   Status On-going   OT SHORT TERM GOAL #4   Title Patient will wieghbear on her left arm with max pa while competing functional activiites.    Time 6   Period Weeks   Status On-going   OT SHORT TERM GOAL #5   Title Patient will have trace mobility in her LUE for increased ability to complete BADLs.   Time 6   Period Weeks   Status Achieved   OT SHORT TERM GOAL #6   Title Patient will complete bathing and dressing with close min guard.    Period Weeks   Status On-going   OT SHORT TERM GOAL #7   Title Patient will attend to left side during ADLs and functional ambulation 50% of time.    Period Weeks   Status On-going           OT Long Term Goals - 08/18/14 1554    OT LONG TERM GOAL #1   Title Patient will use her left arm as an active assist with daily tasks.   Time 12   Period Weeks   Status On-going   OT LONG TERM GOAL #2   Title Patient will have 50% volitional movement in her left arm.   Time 12   Period Weeks   Status On-going   OT LONG TERM GOAL #3   Title Patient will be able to weightbear with min pa or less.   Time 12   Period Weeks   Status On-going   OT LONG TERM GOAL #4   Title Patient will decrease subluxation to 1 digit or less in right shoulder region.    Time 12   Period Weeks   Status On-going   OT LONG TERM GOAL #5   Title Patient will attend to left independently.   Time 12   Period Weeks   Status On-going   OT LONG TERM GOAL #6   Title Patient will improve to Brunnstrom stage IV in LUE.   Time 12   Period Weeks   Status On-going   OT LONG TERM GOAL #7   Title Patient wll complete all ADLs and ambulation with Mod I.   Period Weeks   Status On-going               Plan - 09/01/14 1638    Clinical Impression Statement A: Mother-in-law present during tx session. Pt came without sling  stating she is trying to wear it less and hooks her hand into her pants. Pt completed weightbearing task using theraputty, used RUE to support left wrist during task, OT providing mod assist at elbow. Pt had good  results wtih powder board this session, less facilitation required for protraction. Pt states she feels like it is harder for her to complete the exercises laying down. Did not attempt grasp activity with theraputty due to time constraints.    Plan P: Attempt grasp activity using theraputty. Continue NMES.         Problem List Patient Active Problem List   Diagnosis Date Noted  . Seizures 12/02/2012  . AVM (arteriovenous malformation) 12/02/2012    Ezra Sites, OTR/L  [phone]  09/01/2014, 4:43 PM  [place] [facility] [address] [place], [place], [address] Phone: [phone]   Fax:  [phone]

## 2014-09-01 NOTE — Therapy (Signed)
[place] [facility] [address] [place], [place], [address] Phone: [phone]   Fax:  [phone]  Physical Therapy Treatment  Patient Details  Name: Jennifer Pope MRN: [id number] Date of Birth: 1970/05/06 Referring Provider:  Cassell Clement, MD  Encounter Date: 09/01/2014      PT End of Session - 09/01/14 1623    Visit Number 5   Number of Visits 16   Date for PT Re-Evaluation 09/09/14   Authorization Type BCBS   Authorization - Visit Number 5   Authorization - Number of Visits 16   PT Start Time 1430   PT Stop Time 1515   PT Time Calculation (min) 45 min   Equipment Utilized During Treatment Gait belt   Activity Tolerance Patient tolerated treatment well   Behavior During Therapy [facility] for tasks assessed/performed      Past Medical History  Diagnosis Date  . Seizures   . Arteriovenous malformation   . Stroke     Past Surgical History  Procedure Laterality Date  . Ankle fracture surgery Right     2002    There were no vitals filed for this visit.  Visit Diagnosis:  Weakness due to cerebrovascular accident  Spastic hemiplegia affecting nondominant side  Decreased functional mobility  Left leg weakness  Difficulty walking  Abnormality of gait      Subjective Assessment - 09/01/14 1611    Subjective Admits to not doing exercises much over the last few days. Still working on walking though. Will get working on exercises as able.    Patient is accompained by: Family member   Currently in Pain? No/denies                         [facility] Adult PT Treatment/Exercise - 09/01/14 0001    Bed Mobility   Bed Mobility Rolling Right;Supine to Sit;Sit to Supine   Rolling Right 6: Modified independent (Device/Increase time)   Ambulation/Gait   Ambulation/Gait Yes   Ambulation/Gait Assistance 4: Min guard   Ambulation Distance (Feet) 260 Feet   Assistive device Hemi-walker   Gait Pattern Step-to  pattern;Step-through pattern  cues for step through pattern.    Ambulation Surface Level   Exercises   Other Exercises  Standing knee flexion with moderate assist 1 X 10, Lt leg swing to with focus on heel placement (tape target),    Knee/Hip Exercises: Stretches   Gastroc Stretch 30 seconds;5 reps   Knee/Hip Exercises: Standing   Knee Flexion Strengthening;Left;1 set;10 reps  heavy verbal + tactile cues for Hamstring recruitment   Other Standing Knee Exercises standing hip flexion 1X10 (manual assist for preferred flexion pattern for gait    Other Standing Knee Exercises attempted quadraped position, patient nervous with position and discomfort in LT shoulder.                 PT Education - 09/01/14 1621    Education provided Yes   Education Details Step through pattern with reinforcement at home if has someone with her for safety. Reviewed hamstring activation in prone for home also. Discussed AFO and modification later if struggling with foot drop.    Person(s) Educated Patient;Other (comment)   Methods Explanation;Tactile cues;Verbal cues   Comprehension Verbalized understanding          PT Short Term Goals - 08/10/14 2005    PT SHORT TERM GOAL #1   Title Patient will demosntrate increased ankel dorsiflexion strength of 2/5  MMT to decrease risk of trippign over Lt foot due to Lt foot drop.   Time 4   Period Weeks   Status New   PT SHORT TERM GOAL #2   Title Patient will demonstrate increased Lt hamstring/calf strength of 4-/5 MMT to decrease risk of Lt knee hyper extension.    Time 4   Period Weeks   Status New   PT SHORT TERM GOAL #3   Title Patient will dmeosntrate increased Lt hip flexion of 4/5 MMT to be able to ambulate with improved stride length.    Time 4   Period Weeks   Status New   PT SHORT TERM GOAL #4   Title patient will be abel to ambualte with a SPC for 100 ft   Time 4   Period Weeks   Status New   PT SHORT TERM GOAL #5   Title I with HEP  for progression of strength   Time 4   Period Weeks   Status New           PT Long Term Goals - 08/10/14 2009    PT LONG TERM GOAL #1   Title Patient will demonstrate increased ankle dorsiflexion strength of 3/5 MMT to decrease risk of tripping over Lt foot due to Lt foot drop   Time 8   Period Weeks   Status New   PT LONG TERM GOAL #2   Title Patient will demonstrate increased Lt hamstring/calf strength of 5/5 MMT to decrease risk of Lt knee hyper extension.    Time 8   Period Weeks   Status New   PT LONG TERM GOAL #3   Title Patient will demosntrate increased Lt hip extension of 4/5 MMT to be able to ambulate with improved stride length and perofmr sit to stand without dependence of Rt LE for perofrmance of sit to stand.    Time 8   Period Weeks   Status New   PT LONG TERM GOAL #4   Title Patient will be able to ambulate up and down stairs with only 1 HHA.    Time 8   Period Weeks   Status New   PT LONG TERM GOAL #5   Title Patient will demonstrate increased abdominal strength of 5/5 MMT to be able ride horses.    Time 8   Period Weeks   Status New               Plan - 09/01/14 1624    Clinical Impression Statement Continue to focus on activites to reinforce and progress gait and functional mobility. Struggling with focus on step though pattern, frequent reminders provided during session. Difficulty also with swing phase due to limited dorsiflexion and poor knee andhip flexion ability. Frequent attention made to support Lt shoulder due to subluxation. Patint will bring brace at next session.    Rehab Potential Good   PT Frequency 2x / week   PT Treatment/Interventions Therapeutic exercise;Balance training;Neuromuscular re-education;Patient/family education;Gait training;Manual techniques;Functional mobility training;Therapeutic activities   PT Next Visit Plan Continue with focus on gait with utilizaion on strengthening and neurological facilitation. Hamstring  activation, hip/knee flexion in standing. Patient may benefit from biodex assisted ambulation on treadmill in the future.    PT Home Exercise Plan modify as needed.        Problem List Patient Active Problem List   Diagnosis Date Noted  . Seizures 12/02/2012  . AVM (arteriovenous malformation) 12/02/2012    Christiane Ha, PT, CSCS  09/01/2014, 4:30 PM  [facility] [address] [place], [place], [address] Phone: [phone]   Fax:  [phone]

## 2014-09-03 ENCOUNTER — Ambulatory Visit (HOSPITAL_COMMUNITY): Payer: Federal, State, Local not specified - PPO

## 2014-09-03 ENCOUNTER — Encounter (HOSPITAL_COMMUNITY): Payer: Self-pay

## 2014-09-03 DIAGNOSIS — S43112S Subluxation of left acromioclavicular joint, sequela: Secondary | ICD-10-CM

## 2014-09-03 DIAGNOSIS — I69354 Hemiplegia and hemiparesis following cerebral infarction affecting left non-dominant side: Secondary | ICD-10-CM | POA: Diagnosis not present

## 2014-09-03 DIAGNOSIS — G8110 Spastic hemiplegia affecting unspecified side: Secondary | ICD-10-CM

## 2014-09-03 DIAGNOSIS — IMO0002 Reserved for concepts with insufficient information to code with codable children: Secondary | ICD-10-CM

## 2014-09-03 DIAGNOSIS — R2689 Other abnormalities of gait and mobility: Secondary | ICD-10-CM

## 2014-09-03 DIAGNOSIS — R269 Unspecified abnormalities of gait and mobility: Secondary | ICD-10-CM

## 2014-09-03 DIAGNOSIS — R29898 Other symptoms and signs involving the musculoskeletal system: Secondary | ICD-10-CM

## 2014-09-03 DIAGNOSIS — R262 Difficulty in walking, not elsewhere classified: Secondary | ICD-10-CM

## 2014-09-03 NOTE — Patient Instructions (Signed)
Hamstring Stretch   With other leg bent, foot flat, grasp dog leash around Right foot and slowly try to raise upleg. Hold 30 seconds. Repeat 3 times. Do  1-2 sessions per day.  http://gt2.exer.us/280   Copyright  VHI. All rights reserved.   Bridge   Lie back, legs bent with Left foot closer. Inhale, pressing hips up. Keeping ribs in, lengthen lower back. Exhale, rolling down along spine from top. Repeat 10-20 times. Do 1-2 sessions per day.  Copyright  VHI. All rights reserved.

## 2014-09-03 NOTE — Therapy (Signed)
[facility] [address] [place], [place], [address] Phone: [phone]   Fax:  [phone]  Physical Therapy Treatment  Patient Details  Name: Jennifer Pope MRN: [id number] Date of Birth: 1970/06/03 Referring Provider:  Derrell Lolling, MD  Encounter Date: 09/03/2014      PT End of Session - 09/03/14 1520    Visit Number 6   Number of Visits 16   Date for PT Re-Evaluation 09/09/14   Authorization Type BCBS   Authorization - Visit Number 6   Authorization - Number of Visits 16   PT Start Time 1432   PT Stop Time 1520   PT Time Calculation (min) 48 min   Equipment Utilized During Treatment Gait belt   Activity Tolerance Patient tolerated treatment well;Patient limited by fatigue   Behavior During Therapy [facility] for tasks assessed/performed      Past Medical History  Diagnosis Date  . Seizures   . Arteriovenous malformation   . Stroke     Past Surgical History  Procedure Laterality Date  . Ankle fracture surgery Right     2002    There were no vitals filed for this visit.  Visit Diagnosis:  Weakness due to cerebrovascular accident  Spastic hemiplegia affecting nondominant side  Decreased functional mobility  Left leg weakness  Difficulty walking  Abnormality of gait                       [facility] Adult PT Treatment/Exercise - 09/03/14 0001    Bed Mobility   Bed Mobility Rolling Right;Supine to Sit;Sit to Supine   Rolling Right 4: Min assist   Supine to Sit 6: Modified independent (Device/Increase time)   Sit to Supine 6: Modified independent (Device/Increase time)   Ambulation/Gait   Ambulation/Gait Yes   Ambulation/Gait Assistance 4: Min guard   Ambulation Distance (Feet) 556 Feet   Assistive device Hemi-walker   Gait Pattern Step-to pattern;Step-through pattern   Ambulation Surface Level   Gait Comments Therapist facilitaiton to improve PF, hamstiring curls and to reduce ER with heel  strike   Knee/Hip Exercises: Standing   Forward Lunges Both;10 reps   Forward Lunges Limitations 8 in step   Functional Squat 10 reps   Functional Squat Limitations split stance Lt LE for increased weight bearing   Knee/Hip Exercises: Supine   Heel Slides Left;2 sets;10 reps   Heel Slides Limitations verbal/tactile cues for adduction with slide.    Bridges Both;2 sets;10 reps   Bridges Limitations Lt foot closer to buttocks; 1 set with therapist facilitation to reduceER, 1 set with adduction squeeze with ball between knees                  PT Short Term Goals - 09/03/14 1636    PT SHORT TERM GOAL #1   Title Patient will demosntrate increased ankel dorsiflexion strength of 2/5 MMT to decrease risk of trippign over Lt foot due to Lt foot drop.   Status On-going   PT SHORT TERM GOAL #2   Title Patient will demonstrate increased Lt hamstring/calf strength of 4-/5 MMT to decrease risk of Lt knee hyper extension.    Status On-going   PT SHORT TERM GOAL #3   Title Patient will dmeosntrate increased Lt hip flexion of 4/5 MMT to be able to ambulate with improved stride length.    Status On-going   PT SHORT TERM GOAL #4   Title patient will be abel to [employer]  with a SPC for 100 ft   PT SHORT TERM GOAL #5   Title I with HEP for progression of strength   Status On-going           PT Long Term Goals - 09/03/14 1636    PT LONG TERM GOAL #1   Title Patient will demonstrate increased ankle dorsiflexion strength of 3/5 MMT to decrease risk of tripping over Lt foot due to Lt foot drop   PT LONG TERM GOAL #2   Title Patient will demonstrate increased Lt hamstring/calf strength of 5/5 MMT to decrease risk of Lt knee hyper extension.    PT LONG TERM GOAL #3   Title Patient will demosntrate increased Lt hip extension of 4/5 MMT to be able to ambulate with improved stride length and perofmr sit to stand without dependence of Rt LE for perofrmance of sit to stand.    PT LONG TERM GOAL #4    Title Patient will be able to ambulate up and down stairs with only 1 HHA.    PT LONG TERM GOAL #5   Title Patient will demonstrate increased abdominal strength of 5/5 MMT to be able ride horses.                Plan - 09/03/14 1520    Clinical Impression Statement Session focus on activities to improve gait mechanics and functional mobility incorporating strengthening activities for glut and hamstrings.  Added standing and supine weight bearing exercises with enphasis on increased Lt LE weight bearing.  Therapist facilitation during gait training to improve swing phase for Lt LE due to limited dorsiflexion and poor knee and hip flexion ability.  Pt with brace for Lt UE, no c/o discomfort or subluxaition during session through pt. did have to readjust frequently during gait.     PT Next Visit Plan Continue with focus on gait with utilizaion on strengthening and neurological facilitation. Hamstring activation, hip/knee flexion in standing. Patient may benefit from biodex assisted ambulation on treadmill in the future.         Problem List Patient Active Problem List   Diagnosis Date Noted  . Seizures 12/02/2012  . AVM (arteriovenous malformation) 12/02/2012   Juel Burrow, PTA  Juel Burrow 09/03/2014, 4:48 PM  [facility] [address] [place], [place], [address] Phone: [phone]   Fax:  [phone]

## 2014-09-03 NOTE — Therapy (Signed)
[facility] [address] [place], [place], [address] Phone: [phone]   Fax:  [phone]  Occupational Therapy Treatment  Patient Details  Name: Jennifer Pope MRN: [id number] Date of Birth: [birth year] Referring Provider:  Derrell Lolling, MD  Encounter Date: 09/03/2014      OT End of Session - 09/03/14 1655    Visit Number 9   Number of Visits 36   Date for OT Re-Evaluation 10/10/14  mini reassess 6/15   Authorization Type n/a   OT Start Time 1345   OT Stop Time 1430   OT Time Calculation (min) 45 min   Activity Tolerance Patient tolerated treatment well   Behavior During Therapy [facility] for tasks assessed/performed      Past Medical History  Diagnosis Date  . Seizures   . Arteriovenous malformation   . Stroke     Past Surgical History  Procedure Laterality Date  . Ankle fracture surgery Right     2002    There were no vitals filed for this visit.  Visit Diagnosis:  Weakness due to cerebrovascular accident  Spastic hemiplegia affecting nondominant side  Subluxation of left acromioclavicular joint, sequela      Subjective Assessment - 09/03/14 1645    Subjective  S: My shoulder was hurting a little so I put my sling back on.    Currently in Pain? 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 Helmet on at all times when ambulating.                   OT Treatments/Exercises (OP) - 09/03/14 1646    Exercises   Exercises Neurological Re-education;Shoulder;Elbow;Wrist;Hand   Neurological Re-education Exercises   Grasp and Release Thumb Opposition   Other Grasp and Release Exercises  Using ES to inhibit wrist and finger extensionm patient completed shoulder protraction with assist from therapist to reach for a sponge  on table top. ES completed finger and hand extension and patient then grasped sponge using a lateral pinch. Therapist assisted with moving hand over to container on left side to release. Mod - max difficulty due to timing of sequencing. Pt's hand became tired quickly and lost the ability to pinch.   Other Grasp and Release Exercises  Using ES to inhibit wrist and finger extension, patient completed active left hand gross grasp to perform gripping of theraputty.    Weight Bearing Position Standing   Standing with weight shifting on and off While standing patient used LUE to flatten yellow theraputty. Therapist provided stabilization to LUE elbow and assisted with moving and set-up of Left hand. Pt tolerated well.    Modalities   Modalities Insurance account manager Location left wrist extensors   Statistician Action Academic librarian Parameters 38 mA CC   Electrical Stimulation Goals Neuromuscular facilitation                  OT Short Term Goals - 08/18/14 1553    OT SHORT TERM GOAL #1   Title Patient will be educated on a HEP.   Time 6   Period Weeks   Status On-going   OT SHORT TERM GOAL #2   Title Patient will use  her left arm as a gross assist with daily activites.   Time 6   Period Weeks   Status On-going   OT SHORT TERM GOAL #3   Title Patient will decrease subluxation in left shoulder to 1.5 digits for decreased pain with shoulder use.   Time 6   Period Weeks   Status On-going   OT SHORT TERM GOAL #4   Title Patient will wieghbear on her left arm with max pa while competing functional activiites.    Time 6   Period Weeks   Status On-going   OT SHORT TERM GOAL #5   Title Patient will have trace mobility in her LUE for increased ability to complete BADLs.   Time 6   Period Weeks   Status Achieved   OT SHORT TERM GOAL #6   Title Patient will complete bathing and dressing with close min  guard.    Period Weeks   Status On-going   OT SHORT TERM GOAL #7   Title Patient will attend to left side during ADLs and functional ambulation 50% of time.    Period Weeks   Status On-going           OT Long Term Goals - 08/18/14 1554    OT LONG TERM GOAL #1   Title Patient will use her left arm as an active assist with daily tasks.   Time 12   Period Weeks   Status On-going   OT LONG TERM GOAL #2   Title Patient will have 50% volitional movement in her left arm.   Time 12   Period Weeks   Status On-going   OT LONG TERM GOAL #3   Title Patient will be able to weightbear with min pa or less.   Time 12   Period Weeks   Status On-going   OT LONG TERM GOAL #4   Title Patient will decrease subluxation to 1 digit or less in right shoulder region.    Time 12   Period Weeks   Status On-going   OT LONG TERM GOAL #5   Title Patient will attend to left independently.   Time 12   Period Weeks   Status On-going   OT LONG TERM GOAL #6   Title Patient will improve to Brunnstrom stage IV in LUE.   Time 12   Period Weeks   Status On-going   OT LONG TERM GOAL #7   Title Patient wll complete all ADLs and ambulation with Mod I.   Period Weeks   Status On-going               Plan - 09/03/14 1656    Clinical Impression Statement A: Mother-in-law present during tx session. Pt wearing sling this session stating that her shoulder was hurting her so she put the sling on today. New sling has not arrived yet. Session with focus on grasp and release and weight bearing into LUE.    Plan P: Attempt to place electrode pads on wrist extensors and flexors and use ES to complete sponge task again.         Problem List Patient Active Problem List   Diagnosis Date Noted  . Seizures 12/02/2012  . AVM (arteriovenous malformation) 12/02/2012    Limmie Patricia, OTR/L,CBIS  [phone]  09/03/2014, 4:59 PM  [facility] [address]  [place], [place], [address] Phone: [phone]   Fax:  [phone]

## 2014-09-06 ENCOUNTER — Ambulatory Visit (HOSPITAL_COMMUNITY): Payer: Federal, State, Local not specified - PPO | Admitting: Physical Therapy

## 2014-09-06 ENCOUNTER — Ambulatory Visit (HOSPITAL_COMMUNITY): Payer: Federal, State, Local not specified - PPO

## 2014-09-06 ENCOUNTER — Encounter (HOSPITAL_COMMUNITY): Payer: Self-pay

## 2014-09-06 DIAGNOSIS — R29898 Other symptoms and signs involving the musculoskeletal system: Secondary | ICD-10-CM

## 2014-09-06 DIAGNOSIS — IMO0002 Reserved for concepts with insufficient information to code with codable children: Secondary | ICD-10-CM

## 2014-09-06 DIAGNOSIS — G8110 Spastic hemiplegia affecting unspecified side: Secondary | ICD-10-CM

## 2014-09-06 DIAGNOSIS — I69354 Hemiplegia and hemiparesis following cerebral infarction affecting left non-dominant side: Secondary | ICD-10-CM | POA: Diagnosis not present

## 2014-09-06 DIAGNOSIS — R2689 Other abnormalities of gait and mobility: Secondary | ICD-10-CM

## 2014-09-06 DIAGNOSIS — R262 Difficulty in walking, not elsewhere classified: Secondary | ICD-10-CM

## 2014-09-06 DIAGNOSIS — R269 Unspecified abnormalities of gait and mobility: Secondary | ICD-10-CM

## 2014-09-06 NOTE — Therapy (Signed)
[facility] [address] [place], [place], [address] Phone: [phone]   Fax:  [phone]  Physical Therapy Treatment  Patient Details  Name: Jennifer Pope MRN: [id number] Date of Birth: 13-Aug-1970 Referring Provider:  Derrell Lolling, MD  Encounter Date: 09/06/2014      PT End of Session - 09/06/14 1610    Visit Number 7   Number of Visits 16   Date for PT Re-Evaluation 09/09/14   Authorization Type [employer]   Authorization - Visit Number 7   Authorization - Number of Visits 16   PT Start Time 1517   PT Stop Time 1600   PT Time Calculation (min) 43 min   Equipment Utilized During Treatment Gait belt   Activity Tolerance Patient tolerated treatment well   Behavior During Therapy [facility] for tasks assessed/performed      Past Medical History  Diagnosis Date  . Seizures   . Arteriovenous malformation   . Stroke     Past Surgical History  Procedure Laterality Date  . Ankle fracture surgery Right     2002    There were no vitals filed for this visit.  Visit Diagnosis:  Weakness due to cerebrovascular accident  Spastic hemiplegia affecting nondominant side  Decreased functional mobility  Left leg weakness  Difficulty walking  Abnormality of gait      Subjective Assessment - 09/06/14 1601    Subjective Patient states that she is doing OK today, her L shoulder is hurting her somewhat today   Pertinent History 05/23/14 patient had a sroke secondary to AVM. Lt AFO for prevention of knee yper extension an dprevention of Lt foot drop. pain its predominantly in shoulder secondary to Lt shoulder subluxation for which patient will be seeing OT. patient arriveswwearing helmet for protection of brain flap.    Currently in Pain? --  small amount of pain in L UE                          [facility] Adult PT Treatment/Exercise - 09/06/14 1603    Ambulation/Gait   Ambulation/Gait Yes   Ambulation/Gait Assistance  4: Min guard   Ambulation Distance (Feet) --  4x271ft   Assistive device Hemi-walker   Gait Pattern Step-through pattern  spastic hams L, impaired coordination and control of L LE   Gait Comments PT facilitation to improve functional hamstring activation at appropriate timing, reduce inappropriate hamstring spasticity patterns, L LE trajectory and stability, and improve gait speed    Knee/Hip Exercises: Standing   Other Standing Knee Exercises 3D hip excursions in parallel bars, no HHA; standing quad sets in parallel bars no HHA; eccentric sit to stands no HHA   for antagonistic silencing of spastic hamstrings    Knee/Hip Exercises: Seated   Other Seated Knee Exercises Cone rotation with core activation at edge of mat table to activate core, proximal muscles, general coordination    Manual Therapy   Manual Therapy Other (comment)  functional soft tissue mobilization    Manual therapy comments Passive L hip IR/ER, deep pressure to L hamstrings, soft tissue mobilization of hamstrings while in sustained stretch to reduce spasticity and improve LE function                PT Education - 09/06/14 1609    Education provided Yes   Education Details patient and family education regarding techniques to reduce hamstring spasticity at home and improve overall mobility  Person(s) Educated Patient;Spouse   Methods Explanation   Comprehension Verbalized understanding          PT Short Term Goals - 09/03/14 1636    PT SHORT TERM GOAL #1   Title Patient will demosntrate increased ankel dorsiflexion strength of 2/5 MMT to decrease risk of trippign over Lt foot due to Lt foot drop.   Status On-going   PT SHORT TERM GOAL #2   Title Patient will demonstrate increased Lt hamstring/calf strength of 4-/5 MMT to decrease risk of Lt knee hyper extension.    Status On-going   PT SHORT TERM GOAL #3   Title Patient will dmeosntrate increased Lt hip flexion of 4/5 MMT to be able to ambulate with  improved stride length.    Status On-going   PT SHORT TERM GOAL #4   Title patient will be abel to ambualte with a SPC for 100 ft   PT SHORT TERM GOAL #5   Title I with HEP for progression of strength   Status On-going           PT Long Term Goals - 09/03/14 1636    PT LONG TERM GOAL #1   Title Patient will demonstrate increased ankle dorsiflexion strength of 3/5 MMT to decrease risk of tripping over Lt foot due to Lt foot drop   PT LONG TERM GOAL #2   Title Patient will demonstrate increased Lt hamstring/calf strength of 5/5 MMT to decrease risk of Lt knee hyper extension.    PT LONG TERM GOAL #3   Title Patient will demosntrate increased Lt hip extension of 4/5 MMT to be able to ambulate with improved stride length and perofmr sit to stand without dependence of Rt LE for perofrmance of sit to stand.    PT LONG TERM GOAL #4   Title Patient will be able to ambulate up and down stairs with only 1 HHA.    PT LONG TERM GOAL #5   Title Patient will demonstrate increased abdominal strength of 5/5 MMT to be able ride horses.                Plan - 09/06/14 1611    Clinical Impression Statement Focus on functional neuro facilitation to reduce hamstring spasticity during mobiilty, improve hamstring and general LE function during mobiltiy and improve overall gait mechanics. Performed passive rotations of affected LE, deep pressure stimulation and sustained stretch to affected hamstrings. Also incroporated gross coordination activities to improve overall recruitment patterns and mobility. Performed multiple courses of gait with appropriate cues directly to hamstring muscle group with some success in improving function of L LE and definite success in improving overall gait mechanics with appropriate cues from PT. Patient and family appeared excited and motivated after today's treatment session and the progress made throughout today.    Pt will benefit from skilled therapeutic intervention in  order to improve on the following deficits Abnormal gait;Decreased strength;Difficulty walking;Decreased activity tolerance;Decreased balance;Impaired flexibility;Decreased endurance   Rehab Potential Good   PT Frequency 2x / week   PT Duration 8 weeks   PT Treatment/Interventions Therapeutic exercise;Balance training;Neuromuscular re-education;Patient/family education;Gait training;Manual techniques;Functional mobility training;Therapeutic activities   PT Next Visit Plan Reduce focus on hamstring strengthening and increase focus on appropriate hamstring activation, appropriate neuro facilitations. Also incorporate coordination activities to help faclitate appropriate neuro facilitation patterns. Continue passive rotations, deep pressure to hamstrings, sustained stretching to hamstrings to improve overall functional muscle function.    PT Home Exercise Plan modify as needed.  Consulted and Agree with Plan of Care Patient        Problem List Patient Active Problem List   Diagnosis Date Noted  . Seizures 12/02/2012  . AVM (arteriovenous malformation) 12/02/2012    Nedra Hai PT, DPT [phone]  [facility] [address] [place], [place], [address] Phone: [phone]   Fax:  [phone]

## 2014-09-06 NOTE — Therapy (Signed)
[facility] [address] [place], [place], [address] Phone: [phone]   Fax:  [phone]  Occupational Therapy Treatment  Patient Details  Name: Jennifer Pope MRN: [id number] Date of Birth: [birth year] Referring Provider:  Derrell Lolling, MD  Encounter Date: 09/06/2014      OT End of Session - 09/06/14 1611    Visit Number 10   Number of Visits 36   Date for OT Re-Evaluation 10/10/14  mini reassess 6/15   Authorization Type n/a   OT Start Time 1430   OT Stop Time 1515   OT Time Calculation (min) 45 min   Activity Tolerance Patient tolerated treatment well   Behavior During Therapy [facility] for tasks assessed/performed      Past Medical History  Diagnosis Date  . Seizures   . Arteriovenous malformation   . Stroke     Past Surgical History  Procedure Laterality Date  . Ankle fracture surgery Right     2002    There were no vitals filed for this visit.  Visit Diagnosis:  Weakness due to cerebrovascular accident  Spastic hemiplegia affecting nondominant side      Subjective Assessment - 09/06/14 1559    Subjective  S: My hand just curls up after a while and it drives me crazy.   Currently in Pain? 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 Helmet on at all times when ambulating.                   OT Treatments/Exercises (OP) - 09/06/14 1559    Exercises   Exercises Neurological Re-education   Neurological Re-education Exercises   Grasp and Release Thumb Opposition   Other Grasp and Release Exercises  Using ES to inhibit wrist and finger extensionm patient completed shoulder protraction with assist from therapist to reach for a sponge on table top. ES completed finger and hand extension and patient then grasped  sponge using a lateral pinch. Therapist assisted with moving hand over to container on left side to release. Mod - max difficulty due to gradual fartigue of muscle. Pt's hand became tired quickly and lost the ability to pinch which required the therapist to increase the intensity of the ES. Therapist provided vc's to complete all hand motion with the machine. Pt was able to achieve some active movement of fingers with assist from ES.   Modalities   Modalities Insurance account manager Location left wrist extensors   Statistician Action Academic librarian Parameters 34-36 mA CC   Electrical Stimulation Goals Neuromuscular facilitation                  OT Short Term Goals - 08/18/14 1553    OT SHORT TERM GOAL #1   Title Patient will be educated on a HEP.   Time 6   Period Weeks   Status On-going   OT SHORT TERM GOAL #2   Title Patient will use her left arm as a gross assist with daily activites.   Time 6   Period Weeks   Status On-going   OT SHORT TERM GOAL #3   Title Patient will decrease subluxation in left shoulder to 1.5 digits for  decreased pain with shoulder use.   Time 6   Period Weeks   Status On-going   OT SHORT TERM GOAL #4   Title Patient will wieghbear on her left arm with max pa while competing functional activiites.    Time 6   Period Weeks   Status On-going   OT SHORT TERM GOAL #5   Title Patient will have trace mobility in her LUE for increased ability to complete BADLs.   Time 6   Period Weeks   Status Achieved   OT SHORT TERM GOAL #6   Title Patient will complete bathing and dressing with close min guard.    Period Weeks   Status On-going   OT SHORT TERM GOAL #7   Title Patient will attend to left side during ADLs and functional ambulation 50% of time.    Period Weeks   Status On-going           OT Long Term Goals - 08/18/14 1554    OT LONG TERM GOAL #1   Title Patient  will use her left arm as an active assist with daily tasks.   Time 12   Period Weeks   Status On-going   OT LONG TERM GOAL #2   Title Patient will have 50% volitional movement in her left arm.   Time 12   Period Weeks   Status On-going   OT LONG TERM GOAL #3   Title Patient will be able to weightbear with min pa or less.   Time 12   Period Weeks   Status On-going   OT LONG TERM GOAL #4   Title Patient will decrease subluxation to 1 digit or less in right shoulder region.    Time 12   Period Weeks   Status On-going   OT LONG TERM GOAL #5   Title Patient will attend to left independently.   Time 12   Period Weeks   Status On-going   OT LONG TERM GOAL #6   Title Patient will improve to Brunnstrom stage IV in LUE.   Time 12   Period Weeks   Status On-going   OT LONG TERM GOAL #7   Title Patient wll complete all ADLs and ambulation with Mod I.   Period Weeks   Status On-going               Plan - 09/06/14 1611    Clinical Impression Statement A: Attempt to place electrodes on both wrist flexors and extensors although patient was too sensitive on volar side of forearm to tolerate a high enough intensity. Pt was given larger sling for left and and therapist adjusted it appropriately.    Plan P: Mini reassessment. Cont with weightbearing standing and attempt quadraped         Problem List Patient Active Problem List   Diagnosis Date Noted  . Seizures 12/02/2012  . AVM (arteriovenous malformation) 12/02/2012    Limmie Patricia, OTR/L,CBIS  [phone]  09/06/2014, 4:20 PM  [facility] [address] [place], [place], [address] Phone: [phone]   Fax:  [phone]

## 2014-09-08 ENCOUNTER — Encounter (HOSPITAL_COMMUNITY): Payer: Self-pay

## 2014-09-08 ENCOUNTER — Ambulatory Visit (HOSPITAL_COMMUNITY): Payer: Federal, State, Local not specified - PPO

## 2014-09-08 DIAGNOSIS — R2689 Other abnormalities of gait and mobility: Secondary | ICD-10-CM

## 2014-09-08 DIAGNOSIS — IMO0002 Reserved for concepts with insufficient information to code with codable children: Secondary | ICD-10-CM

## 2014-09-08 DIAGNOSIS — R29898 Other symptoms and signs involving the musculoskeletal system: Secondary | ICD-10-CM

## 2014-09-08 DIAGNOSIS — G8110 Spastic hemiplegia affecting unspecified side: Secondary | ICD-10-CM

## 2014-09-08 DIAGNOSIS — R269 Unspecified abnormalities of gait and mobility: Secondary | ICD-10-CM

## 2014-09-08 DIAGNOSIS — R262 Difficulty in walking, not elsewhere classified: Secondary | ICD-10-CM

## 2014-09-08 DIAGNOSIS — I69354 Hemiplegia and hemiparesis following cerebral infarction affecting left non-dominant side: Secondary | ICD-10-CM | POA: Diagnosis not present

## 2014-09-08 NOTE — Therapy (Signed)
[place], [place], [address] Phone: [phone]   Fax:  [phone]  Occupational Therapy Treatment & Reassessment  Patient Details  Name: Jennifer Pope MRN: [id number] Date of Birth: March 08, 1971 Referring Provider:  Elgie Collard, MD  Encounter Date: 09/08/2014      OT End of Session - 09/08/14 1554    Visit Number 11   Number of Visits 36   Date for OT Re-Evaluation 10/10/14   Authorization Type n/a   OT Start Time 1435   OT Stop Time 1515   OT Time Calculation (min) 40 min   Activity Tolerance Patient tolerated treatment well   Behavior During Therapy [facility] for tasks assessed/performed      Past Medical History  Diagnosis Date  . Seizures   . Arteriovenous malformation   . Stroke     Past Surgical History  Procedure Laterality Date  . Ankle fracture surgery Right     2002    There were no vitals filed for this visit.  Visit Diagnosis:  Weakness due to cerebrovascular accident  Spastic hemiplegia affecting nondominant side      Subjective Assessment - 09/08/14 1551    Subjective  S: My shoulder has been hurting me so I've been wearing the sling more.    Currently in Pain? 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 Helmet on at all times when ambulating.    ADL   Eating/Feeding Set up   Grooming Modified independent   Upper Body Bathing Minimal assistance   Lower Body Bathing Supervision/safety   Upper Body Dressing --  Mod I    Lower Body Dressing Modified independent;Increased time   Toilet Tranfer Modified independent   Toilet Transer Equipment Raised toilet seat with arms (or 3-in-1over toilet)   Toileting - Clothing Manipulation Modified independent   [place]   Tub/Shower Transfer Min guard  just for safety has husband.    Tub/Shower Doctor, general practice Walk in shower   Mobility   Mobility Status Needs assist   Mobility Status Comments Ambulates with distant supervision with hemiwalker.    Written Expression   Dominant Hand Right   Vision - History   Baseline Vision Wears glasses all the time   Coordination   9 Hole Peg Test Right;Left  Unable to test this date   AROM   Overall AROM Comments Assessed seated against gravity then sidelying with gravity eliminated. Not assessed at eval.    AROM Assessment Site Shoulder;Elbow;Forearm;Wrist;Finger;Thumb   Right/Left Shoulder Left   Left Shoulder Flexion 45 Degrees  sidelying: 55 - gravity eliminated   Left Shoulder ABduction 25 Degrees   Right/Left Elbow Left   Left Elbow Flexion 70  sidelying: 70 -gravity eliminated   Left Elbow Extension -20  sidelying: 0 - gravity eliminated   PROM   PROM Assessment Site Shoulder;Elbow;Forearm;Wrist;Finger;Thumb   Strength   Strength Assessment Site Hand;Shoulder;Elbow;Forearm;Wrist   Right/Left Shoulder Left   Left Shoulder Flexion 3-/5   Left Shoulder ABduction 3-/5   Left Shoulder Internal Rotation 1/5   Left Shoulder External Rotation 1/5   Right/Left Elbow Left   Left Elbow Flexion 3-/5   Left Elbow Extension 3-/5   Right/Left Forearm Left  Left Forearm Pronation 1/5   Left Forearm Supination 2-/5   Right/Left Wrist Left   Left Wrist Flexion 1/5   Left Wrist Extension 1/5   Right/Left hand Right;Left   Right Hand Grip (lbs) 85   Right Hand Lateral Pinch 16 lbs   Right Hand 3 Point Pinch 24 lbs   Left Hand Grip (lbs) 15   Left Hand Lateral Pinch 4 lbs   Left Hand 3 Point Pinch 1 lbs   LUE Tone   Brunnstrom Scale (LUE) Considerable increase in muschle tone, passive movement difficult                  OT Treatments/Exercises (OP) - 09/08/14 1551    Bed Mobility   Bed Mobility Rolling Right;Supine to Sit;Sit  to Supine   Rolling Right 5: Supervision   Supine to Sit 6: Modified independent (Device/Increase time)   Sit to Supine 6: Modified independent (Device/Increase time)   Exercises   Exercises Neurological Re-education;Elbow;Wrist   Shoulder Exercises: Sidelying   Flexion AAROM;10 reps   Flexion Limitations Pt used powder board. Able to achieve shoulder flexion to shoulder level before requiring assist from therapist for remaining range. Pt able to bring LUE back down with no assistance from OTR    Other Sidelying Exercises Protraction; 10 reps; using powder board. Pt had good bicep contraction, min tapping/facilitation . Pt required min-mod facilitation for tricep extension.    Elbow Exercises   Forearm Supination AAROM;5 reps;AROM  1 reps   Forearm Pronation AAROM;5 reps                  OT Short Term Goals - 09/08/14 1458    OT SHORT TERM GOAL #1   Title Patient will be educated on a HEP.   Time 6   Period Weeks   Status On-going   OT SHORT TERM GOAL #2   Title Patient will use her left arm as a gross assist with daily activites.   Time 6   Period Weeks   Status On-going   OT SHORT TERM GOAL #3   Title Patient will decrease subluxation in left shoulder to 1.5 digits for decreased pain with shoulder use.   Time 6   Period Weeks   Status On-going   OT SHORT TERM GOAL #4   Title Patient will weightbear on her left arm with max pa while competing functional activiites.    Time 6   Period Weeks   Status On-going   OT SHORT TERM GOAL #5   Title Patient will have trace mobility in her LUE for increased ability to complete BADLs.   Time 6   Period Weeks   Status Achieved   OT SHORT TERM GOAL #6   Title Patient will complete bathing and dressing with close min guard.    Period Weeks   Status Achieved   OT SHORT TERM GOAL #7   Title Patient will attend to left side during ADLs and functional ambulation 50% of time.    Period Weeks   Status Achieved            OT Long Term Goals - 09/08/14 1502    OT LONG TERM GOAL #1   Title Patient will use her left arm as an active assist with daily tasks.   Time 12   Period Weeks   Status On-going   OT LONG TERM GOAL #2   Title Patient will have 50% volitional movement in her left arm.  Time 12   Period Weeks   Status On-going   OT LONG TERM GOAL #3   Title Patient will be able to weightbear with min pa or less.   Time 12   Period Weeks   Status On-going   OT LONG TERM GOAL #4   Title Patient will decrease subluxation to 1 digit or less in right shoulder region.    Time 12   Period Weeks   Status On-going   OT LONG TERM GOAL #5   Title Patient will attend to left independently.   Time 12   Period Weeks   Status On-going   OT LONG TERM GOAL #6   Title Patient will improve to Brunnstrom stage IV in LUE.   Time 12   Period Weeks   Status On-going   OT LONG TERM GOAL #7   Title Patient wll complete all ADLs and ambulation with Mod I.   Period Weeks   Status Achieved               Plan - 09/08/14 1555    Clinical Impression Statement A: Mini reassessment completed this date. patient did not believe she had made a significant amount of progress towards goals although she has met 3/7 STGs and 1/7 LTGs. Patient is Mod I for bathing and dressing. Pt only requires min guard to transfer in/out of shower due to safety. Therapist was able to measure some AROM of shoulder and elbow as well as pinch and grip strength.    Plan Cont with weightbearing standing and attempt quadreped.         Problem List Patient Active Problem List   Diagnosis Date Noted  . Seizures 12/02/2012  . AVM (arteriovenous malformation) 12/02/2012    Ailene Ravel, OTR/L,CBIS  [phone]  09/08/2014, 4:04 PM  [place] [address] [place], [place], [address] Phone: [phone]   Fax:  [phone]

## 2014-09-08 NOTE — Therapy (Signed)
[facility] [facility] [address] [place], [place], [address] Phone: [phone]   Fax:  [phone]  Physical Therapy Treatment  Patient Details  Name: Jennifer Pope MRN: [id number] Date of Birth: 1970/11/11 Referring Provider:  Cassell Clement, MD  Encounter Date: 09/08/2014      PT End of Session - 09/08/14 1523    Visit Number 8   Number of Visits 16   Authorization Type [employer]   Authorization - Visit Number 8   Authorization - Number of Visits 16   PT Start Time 1515   PT Stop Time 1603   PT Time Calculation (min) 48 min   Equipment Utilized During Treatment Gait belt   Activity Tolerance Patient tolerated treatment well   Behavior During Therapy [facility] for tasks assessed/performed      Past Medical History  Diagnosis Date  . Seizures   . Arteriovenous malformation   . Stroke     Past Surgical History  Procedure Laterality Date  . Ankle fracture surgery Right     2002    There were no vitals filed for this visit.  Visit Diagnosis:  Weakness due to cerebrovascular accident  Spastic hemiplegia affecting nondominant side  Decreased functional mobility  Left leg weakness  Difficulty walking  Abnormality of gait      Subjective Assessment - 09/08/14 1519    Subjective Pt stated pain free today, has been compliant with HEP.  Feels her balance is more difficulty todaty, feels her walkling is improving.   Currently in Pain? No/denies            [facility] PT Assessment - 09/08/14 1524    Assessment   Medical Diagnosis Stroke   Onset Date/Surgical Date 05/23/14   Next MD Visit Derrell Lolling   Prior Therapy yes   Precautions   Precautions Fall;Other (comment)   Precaution Comments left side neglect. Helmet on at all times when ambulating.    AROM   Left Shoulder Flexion --  sidelying: 55 - gravity eliminated   Left Elbow Flexion --  sidelying: 70 -gravity eliminated   Left Elbow Extension --   sidelying: 0 - gravity eliminated   Right Hip Internal Rotation  43  was 20   Left Hip Flexion 35  Internal rotation was 20   PROM   PROM Assessment Site Hip;Knee;Ankle   Strength   Right Hip Flexion 4+/5  was 4+/5   Right Hip Extension 3-/5   Right Hip ABduction 4+/5   Left Hip Flexion 3+/5  was 4-/5   Left Hip Extension 3-/5   Left Hip ABduction 2+/5   Right/Left Knee Right;Left   Right Knee Flexion 4+/5  was 4+/5   Right Knee Extension 4+/5  was 5/5   Left Knee Flexion 2-/5  was 2-/5   Left Knee Extension 2/5  was 2-/5   Right/Left Ankle Right;Left   Right Ankle Dorsiflexion 5/5  was 4+/5   Right Ankle Plantar Flexion 3+/5   Left Ankle Dorsiflexion 2-/5  was 0/5   Left Ankle Plantar Flexion 2-/5  was 2-/5                     [facility] Adult PT Treatment/Exercise - 09/08/14 1615    Bed Mobility   Bed Mobility --   Rolling Right --   Supine to Sit --   Sit to Supine --   Ambulation/Gait   Ambulation/Gait Yes   Ambulation/Gait Assistance 4: Min guard  Ambulation Distance (Feet) 226 Feet   Assistive device Hemi-walker;Large base quad cane   Gait Pattern Step-through pattern   Gait Comments PT facilitation to improve functional hamstring activation at appropriate timing, reduce inappropriate hamstring spasticity patterns, L LE trajectory and stability, and improve gait speed               Knee/Hip Exercises: Standing   Terminal Knee Extension Left;10 reps;2 sets   Other Standing Knee Exercises 3D hip excursions in parallel bars, no HHA; standing quad sets in parallel bars no HHA; eccentric sit to stands no HHA    Other Standing Knee Exercises 10 STS   Knee/Hip Exercises: Seated   Other Seated Knee Exercises Cone rotation with core activation at edge of mat table to activate core, proximal muscles, general coordination ; 2 sets with therapist facilitati                           PT Short Term Goals - 09/08/14 1523    PT SHORT TERM GOAL #1    Title Patient will demosntrate increased ankel dorsiflexion strength of 2/5 MMT to decrease risk of trippign over Lt foot due to Lt foot drop.   Status On-going   PT SHORT TERM GOAL #2   Title Patient will demonstrate increased Lt hamstring/calf strength of 4-/5 MMT to decrease risk of Lt knee hyper extension.    Status On-going   PT SHORT TERM GOAL #3   Title Patient will dmeosntrate increased Lt hip flexion of 4/5 MMT to be able to ambulate with improved stride length.    Status On-going   PT SHORT TERM GOAL #4   Title patient will be abel to ambualte with a SPC for 100 ft   Status On-going   PT SHORT TERM GOAL #5   Title I with HEP for progression of strength   Baseline 09/08/2014 Reports compliance with HEP daily   Status On-going           PT Long Term Goals - 09/08/14 1545    PT LONG TERM GOAL #1   Title Patient will demonstrate increased ankle dorsiflexion strength of 3/5 MMT to decrease risk of tripping over Lt foot due to Lt foot drop   Status On-going   PT LONG TERM GOAL #2   Title Patient will demonstrate increased Lt hamstring/calf strength of 5/5 MMT to decrease risk of Lt knee hyper extension.    Status On-going   PT LONG TERM GOAL #3   Title Patient will demosntrate increased Lt hip extension of 4/5 MMT to be able to ambulate with improved stride length and perofmr sit to stand without dependence of Rt LE for perofrmance of sit to stand.    Status On-going   PT LONG TERM GOAL #4   Title Patient will be able to ambulate up and down stairs with only 1 HHA.    Status On-going   PT LONG TERM GOAL #5   Title Patient will demonstrate increased abdominal strength of 5/5 MMT to be able ride horses.                Plan - 09/08/14 1825    Clinical Impression Statement Reassessment complete wtih the following findings:  Pt reports compliance with HEP daily and reports improved confidence with gait though feels her balance is off today.  Overall strength is  progressing though slowly with Lt LE.  Began gait training with large base quad cane  this session for LRAD, pt able to demonstrate appropraite sequence with initally 3 point step then 2 point step pattern as she got used to new AD.  Manual therapist failitation to Lt hamstrings to reduce spasticity during gait assisted with improved gait mechanics.  Pt will continue to benefit from skilled intervention for functioanl strengthening and neuro facilitaiotn to reduce spasticiity to improve gait mechanics towards STG and LTGs   PT Next Visit Plan Recommend continuing OPPT for 4 more weeks to reach goals unmet.  Next session reduce focus on hamstring strengthening and increase focus on appropriate hamstring activation, appropriate neuro facilitations. Also incorporate coordination activities to help faclitate appropriate neuro facilitation patterns. Continue passive rotations, deep pressure to hamstrings, sustained stretching to hamstrings to improve overall functional muscle function.         Problem List Patient Active Problem List   Diagnosis Date Noted  . Seizures 12/02/2012  . AVM (arteriovenous malformation) 12/02/2012   Juel Burrow, PTA  Juel Burrow 09/08/2014, 6:35 PM  [place] [facility] [address] [place], [place], [address] Phone: [phone]   Fax:  [phone]

## 2014-09-10 ENCOUNTER — Ambulatory Visit (HOSPITAL_COMMUNITY): Payer: Federal, State, Local not specified - PPO

## 2014-09-10 ENCOUNTER — Encounter (HOSPITAL_COMMUNITY): Payer: Self-pay

## 2014-09-10 DIAGNOSIS — G8110 Spastic hemiplegia affecting unspecified side: Secondary | ICD-10-CM

## 2014-09-10 DIAGNOSIS — I69354 Hemiplegia and hemiparesis following cerebral infarction affecting left non-dominant side: Secondary | ICD-10-CM | POA: Diagnosis not present

## 2014-09-10 DIAGNOSIS — S43112S Subluxation of left acromioclavicular joint, sequela: Secondary | ICD-10-CM

## 2014-09-10 DIAGNOSIS — IMO0002 Reserved for concepts with insufficient information to code with codable children: Secondary | ICD-10-CM

## 2014-09-10 NOTE — Therapy (Signed)
[facility] [address] [place], [place], [address] Phone: [phone]   Fax:  [phone]  Occupational Therapy Treatment  Patient Details  Name: Jennifer Pope MRN: [id number] Date of Birth: 1970/04/20 Referring Provider:  Derrell Lolling, MD  Encounter Date: 09/10/2014      OT End of Session - 09/10/14 1505    Visit Number 12   Number of Visits 36   Date for OT Re-Evaluation 10/10/14   Authorization Type n/a   OT Start Time 1345   OT Stop Time 1430   OT Time Calculation (min) 45 min   Activity Tolerance Patient tolerated treatment well   Behavior During Therapy [facility] for tasks assessed/performed      Past Medical History  Diagnosis Date  . Seizures   . Arteriovenous malformation   . Stroke     Past Surgical History  Procedure Laterality Date  . Ankle fracture surgery Right     2002    There were no vitals filed for this visit.  Visit Diagnosis:  Spastic hemiplegia affecting nondominant side  Subluxation of left acromioclavicular joint, sequela  Weakness due to cerebrovascular accident      Subjective Assessment - 09/10/14 1457    Subjective  S: I tried to get on all fours at home. It was hilarious. It didn't work out so well on the bed.    Currently in Pain? 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 Helmet on at all times when ambulating.                   OT Treatments/Exercises (OP) - 09/10/14 1458    Exercises   Exercises Neurological Re-education   Visual/Perceptual Exercises   Other Exercises Mirror therapy completed with LUE to cause motor activity in the affected side of brain, to provide visual input and to stimulate brain.Marland Kitchen   Neurological Re-education Exercises   Weight Bearing Position Quadraped    Other Weight-Bearing Exercises 1 Quadraped with weight on palms. Used blue foam cushion for left hand to increase comfort. Max A to maintain proper position of LUE and prevent buckling. 3 trials completed. Pt transitioned from tall kneeling down to quadraped. Pt increased time each trial with the last one at 5 minutes. Periodically during weightbearing patient raised right hand to increase weight on LUE.    Weight Shifting Other   Weight-Shifting Exercises - Other Weightshifting completed while quadraped. Pt weightshifted side to side and back and forth with Max A to prevent left elbow buckling.   Weight Bearing Technique   Weight Bearing Technique Yes                  OT Short Term Goals - 09/10/14 1509    OT SHORT TERM GOAL #1   Title Patient will be educated on a HEP.   Time 6   Period Weeks   Status On-going   OT SHORT TERM GOAL #2   Title Patient will use her left arm as a gross assist with daily activites.   Time 6   Period Weeks   Status On-going   OT SHORT TERM GOAL #3   Title Patient will decrease subluxation in left shoulder to 1.5 digits for decreased pain with shoulder use.  Time 6   Period Weeks   Status On-going   OT SHORT TERM GOAL #4   Title Patient will weightbear on her left arm with max pa while competing functional activiites.    Time 6   Period Weeks   Status On-going   OT SHORT TERM GOAL #5   Title Patient will have trace mobility in her LUE for increased ability to complete BADLs.   Time 6   Period Weeks   OT SHORT TERM GOAL #6   Title Patient will complete bathing and dressing with close min guard.    Period Weeks   OT SHORT TERM GOAL #7   Title Patient will attend to left side during ADLs and functional ambulation 50% of time.    Period Weeks           OT Long Term Goals - 09/10/14 1509    OT LONG TERM GOAL #1   Title Patient will use her left arm as an active assist with daily tasks.   Time 12   Period Weeks   Status On-going    OT LONG TERM GOAL #2   Title Patient will have 50% volitional movement in her left arm.   Time 12   Period Weeks   Status On-going   OT LONG TERM GOAL #3   Title Patient will be able to weightbear with min pa or less.   Time 12   Period Weeks   Status On-going   OT LONG TERM GOAL #4   Title Patient will decrease subluxation to 1 digit or less in right shoulder region.    Time 12   Period Weeks   Status On-going   OT LONG TERM GOAL #5   Title Patient will attend to left independently.   Time 12   Period Weeks   Status On-going   OT LONG TERM GOAL #6   Title Patient will improve to Brunnstrom stage IV in LUE.   Time 12   Period Weeks   Status On-going   OT LONG TERM GOAL #7   Title Patient wll complete all ADLs and ambulation with Mod I.   Period Weeks               Plan - 09/10/14 1505    Clinical Impression Statement A: Pt stated during mirror therapy that it felt like her left hand was completing the movements. She also stated that she had a headache after completing so it must be working. Therapist did see her left thumb twitch during mirror therapy.    Plan Cont with weightbearing in quadraped and mirror therapy.         Problem List Patient Active Problem List   Diagnosis Date Noted  . Seizures 12/02/2012  . AVM (arteriovenous malformation) 12/02/2012    Limmie Patricia, OTR/L,CBIS  [phone]  09/10/2014, 3:10 PM  [place] [facility] [address] [place], [place], [address] Phone: [phone]   Fax:  [phone]

## 2014-09-13 ENCOUNTER — Ambulatory Visit (HOSPITAL_COMMUNITY): Payer: Federal, State, Local not specified - PPO | Admitting: Speech Pathology

## 2014-09-13 ENCOUNTER — Ambulatory Visit (HOSPITAL_COMMUNITY): Payer: Federal, State, Local not specified - PPO | Admitting: Physical Therapy

## 2014-09-13 ENCOUNTER — Encounter (HOSPITAL_COMMUNITY): Payer: Self-pay

## 2014-09-13 ENCOUNTER — Ambulatory Visit (HOSPITAL_COMMUNITY): Payer: Federal, State, Local not specified - PPO

## 2014-09-13 DIAGNOSIS — R29898 Other symptoms and signs involving the musculoskeletal system: Secondary | ICD-10-CM

## 2014-09-13 DIAGNOSIS — I69354 Hemiplegia and hemiparesis following cerebral infarction affecting left non-dominant side: Secondary | ICD-10-CM | POA: Diagnosis not present

## 2014-09-13 DIAGNOSIS — IMO0002 Reserved for concepts with insufficient information to code with codable children: Secondary | ICD-10-CM

## 2014-09-13 DIAGNOSIS — R262 Difficulty in walking, not elsewhere classified: Secondary | ICD-10-CM

## 2014-09-13 DIAGNOSIS — R2689 Other abnormalities of gait and mobility: Secondary | ICD-10-CM

## 2014-09-13 DIAGNOSIS — G8110 Spastic hemiplegia affecting unspecified side: Secondary | ICD-10-CM

## 2014-09-13 DIAGNOSIS — I69119 Unspecified symptoms and signs involving cognitive functions following nontraumatic intracerebral hemorrhage: Secondary | ICD-10-CM

## 2014-09-13 DIAGNOSIS — R269 Unspecified abnormalities of gait and mobility: Secondary | ICD-10-CM

## 2014-09-13 DIAGNOSIS — S43112S Subluxation of left acromioclavicular joint, sequela: Secondary | ICD-10-CM

## 2014-09-13 NOTE — Therapy (Signed)
[facility] [address] [place], [place], [address] Phone: [phone]   Fax:  [phone]  Physical Therapy Treatment  Patient Details  Name: Jennifer Pope MRN: [id number] Date of Birth: 28-Dec-1970 Referring Provider:  Cassell Clement, MD  Encounter Date: 09/13/2014      PT End of Session - 09/13/14 1612    Visit Number 9   Number of Visits 16   Date for PT Re-Evaluation 10/11/14   Authorization Type BCBS   Authorization - Visit Number 9   Authorization - Number of Visits 16   PT Start Time 1516   PT Stop Time 1600   PT Time Calculation (min) 44 min   Equipment Utilized During Treatment Gait belt   Activity Tolerance Patient tolerated treatment well   Behavior During Therapy [facility] for tasks assessed/performed      Past Medical History  Diagnosis Date  . Seizures   . Arteriovenous malformation   . Stroke     Past Surgical History  Procedure Laterality Date  . Ankle fracture surgery Right     2002    There were no vitals filed for this visit.  Visit Diagnosis:  Weakness due to cerebrovascular accident  Decreased functional mobility  Left leg weakness  Difficulty walking  Abnormality of gait      Subjective Assessment - 09/13/14 1606    Subjective Patient and family states she is doing well today, had a big trip out for fathers day with family; Also states that she is concerned about being able to get back up if she does fall    Patient is accompained by: Family member   Pertinent History 05/23/14 patient had a sroke secondary to AVM. Lt AFO for prevention of knee yper extension an dprevention of Lt foot drop. pain its predominantly in shoulder secondary to Lt shoulder subluxation for which patient will be seeing OT. patient arriveswwearing helmet for protection of brain flap.    Currently in Pain? No/denies                         [facility] Adult PT Treatment/Exercise - 09/13/14 0001    Ambulation/Gait   Ambulation/Gait Yes   Ambulation/Gait Assistance 4: Min guard   Ambulation Distance (Feet) --  4x252ft   Assistive device Hemi-walker;Small based quad cane   Gait Pattern Step-through pattern  poor proprioception and impaired motor control affected LE    Gait Comments PT facilitation to improve functional hamstring activation at appropriate timing, improve LE proprioception L LE, L LE trajectory and stability, and improve gait speed    Knee/Hip Exercises: Standing   Other Standing Knee Exercises Floor to stand training today; requires Max(A) from PT for full floor to stand transfer, Min-Mod(A) for mini-squats by side of mat table with pushoff of table with R UE    Knee/Hip Exercises: Seated   Other Seated Knee/Hip Exercises Cone rotation with core activation at edge of mat table to activate core, proximal muscles, general coordination ; 2 sets with therapist facilitati   Other Seated Knee/Hip Exercises Pelvic walking forwards and backwards on mat table 1x3    Knee/Hip Exercises: Supine   Other Supine Knee/Hip Exercises Supine L hip ABD 1x15   Knee/Hip Exercises: Sidelying   Other Sidelying Knee/Hip Exercises Knee flexion/extension 1x15 L; attempted L ankle DF with tactile cues for appropriate muscle activation                 PT  Education - 09/13/14 1611    Education provided Yes   Education Details floor to stand training technique    Person(s) Educated Patient   Methods Explanation   Comprehension Verbalized understanding          PT Short Term Goals - 09/08/14 1523    PT SHORT TERM GOAL #1   Title Patient will demosntrate increased ankel dorsiflexion strength of 2/5 MMT to decrease risk of trippign over Lt foot due to Lt foot drop.   Status On-going   PT SHORT TERM GOAL #2   Title Patient will demonstrate increased Lt hamstring/calf strength of 4-/5 MMT to decrease risk of Lt knee hyper extension.    Status On-going   PT SHORT TERM GOAL #3   Title  Patient will dmeosntrate increased Lt hip flexion of 4/5 MMT to be able to ambulate with improved stride length.    Status On-going   PT SHORT TERM GOAL #4   Title patient will be abel to ambualte with a SPC for 100 ft   Status On-going   PT SHORT TERM GOAL #5   Title I with HEP for progression of strength   Baseline 09/08/2014 Reports compliance with HEP daily   Status On-going           PT Long Term Goals - 09/08/14 1545    PT LONG TERM GOAL #1   Title Patient will demonstrate increased ankle dorsiflexion strength of 3/5 MMT to decrease risk of tripping over Lt foot due to Lt foot drop   Status On-going   PT LONG TERM GOAL #2   Title Patient will demonstrate increased Lt hamstring/calf strength of 5/5 MMT to decrease risk of Lt knee hyper extension.    Status On-going   PT LONG TERM GOAL #3   Title Patient will demosntrate increased Lt hip extension of 4/5 MMT to be able to ambulate with improved stride length and perofmr sit to stand without dependence of Rt LE for perofrmance of sit to stand.    Status On-going   PT LONG TERM GOAL #4   Title Patient will be able to ambulate up and down stairs with only 1 HHA.    Status On-going   PT LONG TERM GOAL #5   Title Patient will demonstrate increased abdominal strength of 5/5 MMT to be able ride horses.                Plan - 09/13/14 1612    Clinical Impression Statement Focus on functional exercises in gravity eliminated positions, gait training with hemi-walker and quad cane today with appropraite cues for L hamstring and appropriate mechanics of L LE during gait, also introduced floor to stand training. Patient requires Max(A) for full floor to stand transfer however is able to complete partial floor to stand transfer, in form of mini=squat, with Min-Mod(A) from PT to stabilize and increase L LE function in activity. Patient highly fatigued by floor to stand transfers and pre-transfers. Improving with stability with quad cane  today.    Pt will benefit from skilled therapeutic intervention in order to improve on the following deficits Abnormal gait;Decreased strength;Difficulty walking;Decreased activity tolerance;Decreased balance;Impaired flexibility;Decreased endurance   Rehab Potential Good   PT Frequency 2x / week   PT Duration 8 weeks   PT Treatment/Interventions Therapeutic exercise;Balance training;Neuromuscular re-education;Patient/family education;Gait training;Manual techniques;Functional mobility training;Therapeutic activities   PT Next Visit Plan Gravity eliminated exercises as appropriate, neuro facilitation to reduce stiffness and improve mobility/timing L LE, coordination activities,  floor to stand training for patient and family, gait training with quad cane    PT Home Exercise Plan modify as needed.   Consulted and Agree with Plan of Care Patient        Problem List Patient Active Problem List   Diagnosis Date Noted  . Seizures 12/02/2012  . AVM (arteriovenous malformation) 12/02/2012    Nedra Hai PT, DPT [phone]  [facility] [address] [place], [place], [address] Phone: [phone]   Fax:  [phone]

## 2014-09-13 NOTE — Therapy (Signed)
[facility] [address] [place], [place], [address] Phone: [phone]   Fax:  [phone]  Speech Language Pathology Treatment  Patient Details  Name: Jennifer Pope MRN: [id number] Date of Birth: 12-02-1970 Referring Provider:  Cassell Clement, MD  Encounter Date: 09/13/2014      End of Session - 09/13/14 1539    Visit Number 2   Number of Visits 16   Date for SLP Re-Evaluation 10/11/14   Authorization Type BCBS federal   Authorization Time Period 08/11/2014-10/11/2014   Authorization - Visit Number 2   Authorization - Number of Visits 16   SLP Start Time 1345   SLP Stop Time  1430   SLP Time Calculation (min) 45 min   Activity Tolerance Patient tolerated treatment well      Past Medical History  Diagnosis Date  . Seizures   . Arteriovenous malformation   . Stroke     Past Surgical History  Procedure Laterality Date  . Ankle fracture surgery Right     2002    There were no vitals filed for this visit.  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 No/denies               ADULT SLP TREATMENT - 09/13/14 1537    General Information   Behavior/Cognition Alert;Cooperative;Pleasant mood   Patient Positioning Upright in chair   Oral care provided N/A   HPI Mrs. Jennifer Pope is a 44 year old woman with a known h/o AVM previously treated with SRS in 2010 who presented to ER on 05/23/2014 with acute onset left-sided weakness. 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 She was emergently taken to OR for hemicraniotomy for evacuation of hematoma, subsequently she underwent trach and PEG placement which have since been removed. 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 The bone flap is currently being stored until swelling has subsided and pt expects this will be replaced in 6-8 months. She was on a modified diet for some time, but was discharged from [facility] on a regular diet with thin liquids (no longer needs to utilize chin tuck). Pt made tremendous progress while at [facility] and was discharged with mild cognitive deficits (previously mod/severe) and dysphonia. She is referred for outpatient SLP therapy to address residual deficits and increase independence/maximize functional return. She has good family support from her husband.    Treatment Provided   Treatment provided Cognitive-Linquistic   Pain Assessment   Pain Assessment No/denies pain   Cognitive-Linquistic Treatment   Treatment focused on Cognition;Patient/family/caregiver education   Skilled Treatment problem solving and memory skills;    Assessment / Recommendations / Plan   Plan Continue with current plan of care            SLP Short Term Goals - 09/13/14 1547    SLP SHORT TERM GOAL #1   Title Pt will increase maximum exhalation time to 15 seconds with use of breath support exercises and mild/mod cues   Baseline 6 seconds   Time 4   Period Weeks   Status On-going   SLP SHORT TERM GOAL #2   Title Pt will complete moderate level working memory tasks with 95% acc with  use of compensatory strategies with mild/mod cues.   Baseline 75%   Time 4   Period Weeks   Status On-going   SLP SHORT TERM GOAL #3   Title Pt will be oriented to time and complex situations with 100% acc with use of calendar/planner and min cues.   Baseline 75%   Time 4   Period Weeks   Status On-going   SLP SHORT TERM GOAL #4   Title Pt will increase vocal intensity to [facility] for small group setting with use of compensatory strategies and mild/mod cues from SLP.   Baseline mild/mod impairment   Time 4   Period Weeks   Status On-going          SLP Long Term  Goals - 09/13/14 1547    SLP LONG TERM GOAL #1   Title Pt will demonstrate improved phonation to [facility] for small group setting with use of strategies.   Baseline mild/mod impairment   Time 8   Period Weeks   Status On-going   SLP LONG TERM GOAL #2   Title Pt will increase attention and memory skills to [facility] for independent environment with use of compensatory strategies as needed.    Baseline supervision   Time 8   Period Weeks   Status On-going          Plan - 09/13/14 1540    Clinical Impression Statement Jennifer Pope was accompanied by her husband for her first treatment following her initial evaluation at the end of May. Pt with noticeable improvement in vocal intensity. She reports that she finds that her voice is "stronger" earlier in the day and becomes fatigued toward the end of day. Pt able to identify areas of current concern related to being able to stay home alone and problem solve tactics to overcome problems with min verbal cues. SLP suggested she practice what she should do in case she should fall at home with no one present. SLP will d/w PT as well. Memory task completed with 10-item cued recall with the following results: 1st trial 5/10, 2nd trial 9/10, and 3rd trial 10/10. The patient was then able to name 8/10 words independently. Folder with homework and memory strategies presented.    Speech Therapy Frequency 2x / week   Duration --  8 weeks   Treatment/Interventions Cueing hierarchy;Compensatory techniques;Cognitive reorganization;Internal/external aids;Oral motor exercises;SLP instruction and feedback;Patient/family education;Compensatory strategies   Potential to Achieve Goals Good   SLP Home Exercise Plan Pt will be independent with HEP to facilitate carryover of treatment strategies in home/community environment.   Consulted and Agree with Plan of Care Patient;Family member/caregiver   Family Member Consulted husband, Derrick        Problem List Patient Active  Problem List   Diagnosis Date Noted  . Seizures 12/02/2012  . AVM (arteriovenous malformation) 12/02/2012   Thank you,  Havery Moros, CCC-SLP [phone]  [facility] 09/13/2014, 3:49 PM  [facility] [address] [place], [place], [address] Phone: [phone]   Fax:  [phone]

## 2014-09-13 NOTE — Therapy (Signed)
[facility] [address] [place], [place], [address] Phone: [phone]   Fax:  [phone]  Occupational Therapy Treatment  Patient Details  Name: Jennifer Pope MRN: [id number] Date of Birth: 1970/08/30 Referring Provider:  Cassell Clement, MD  Encounter Date: 09/13/2014      OT End of Session - 09/13/14 1623    Visit Number 13   Number of Visits 36   Date for OT Re-Evaluation 10/10/14   Authorization Type n/a   OT Start Time 1435   OT Stop Time 1515   OT Time Calculation (min) 40 min   Activity Tolerance Patient tolerated treatment well   Behavior During Therapy [facility] for tasks assessed/performed      Past Medical History  Diagnosis Date  . Seizures   . Arteriovenous malformation   . Stroke     Past Surgical History  Procedure Laterality Date  . Ankle fracture surgery Right     2002    There were no vitals filed for this visit.  Visit Diagnosis:  Spastic hemiplegia affecting nondominant side  Subluxation of left acromioclavicular joint, sequela  Weakness due to cerebrovascular accident      Subjective Assessment - 09/13/14 1620    Subjective  S: I didn't get a chance to try this at home yet with my husband.    Currently in Pain? No/denies            [facility] OT Assessment - 09/13/14 1621    Assessment   Diagnosis ICH secondary to AVM with left side hemiparesis   Precautions   Precautions Fall;Other (comment)   Precaution Comments left side neglect. Helmet on at all times when ambulating.                   OT Treatments/Exercises (OP) - 09/13/14 1621    Exercises   Exercises Neurological Re-education   Visual/Perceptual Exercises   Other Exercises Mirror therapy completed with LUE to cause motor activity in the affected side of brain, to provide visual input and to stimulate brain.Marland Kitchen   Neurological Re-education Exercises   Weight Bearing Position Quadraped   Other Weight-Bearing  Exercises 1 Quadraped with weight on palms. Used blue foam cushion for left hand to increase comfort. Max A to maintain proper position of LUE and prevent buckling. Two 5 minute trials completed. Pt transitioned from tall kneeling down to quadraped. Periodically during weightbearing patient raised right hand to increase weight on LUE.    Weight Shifting Other   Weight-Shifting Exercises - Other Weightshifting completed while quadraped. Pt weightshifted side to side and back and forth with Max A to prevent left elbow buckling.                  OT Short Term Goals - 09/10/14 1509    OT SHORT TERM GOAL #1   Title Patient will be educated on a HEP.   Time 6   Period Weeks   Status On-going   OT SHORT TERM GOAL #2   Title Patient will use her left arm as a gross assist with daily activites.   Time 6   Period Weeks   Status On-going   OT SHORT TERM GOAL #3   Title Patient will decrease subluxation in left shoulder to 1.5 digits for decreased pain with shoulder use.   Time 6   Period Weeks   Status On-going   OT SHORT TERM GOAL #4   Title Patient will weightbear on her left  arm with max pa while competing functional activiites.    Time 6   Period Weeks   Status On-going   OT SHORT TERM GOAL #5   Title Patient will have trace mobility in her LUE for increased ability to complete BADLs.   Time 6   Period Weeks   OT SHORT TERM GOAL #6   Title Patient will complete bathing and dressing with close min guard.    Period Weeks   OT SHORT TERM GOAL #7   Title Patient will attend to left side during ADLs and functional ambulation 50% of time.    Period Weeks           OT Long Term Goals - 09/10/14 1509    OT LONG TERM GOAL #1   Title Patient will use her left arm as an active assist with daily tasks.   Time 12   Period Weeks   Status On-going   OT LONG TERM GOAL #2   Title Patient will have 50% volitional movement in her left arm.   Time 12   Period Weeks   Status  On-going   OT LONG TERM GOAL #3   Title Patient will be able to weightbear with min pa or less.   Time 12   Period Weeks   Status On-going   OT LONG TERM GOAL #4   Title Patient will decrease subluxation to 1 digit or less in right shoulder region.    Time 12   Period Weeks   Status On-going   OT LONG TERM GOAL #5   Title Patient will attend to left independently.   Time 12   Period Weeks   Status On-going   OT LONG TERM GOAL #6   Title Patient will improve to Brunnstrom stage IV in LUE.   Time 12   Period Weeks   Status On-going   OT LONG TERM GOAL #7   Title Patient wll complete all ADLs and ambulation with Mod I.   Period Weeks               Plan - 09/13/14 1624    Clinical Impression Statement A: Pt wore finger extension splint to clinic this date as last session it was discussed to try and wear it more frequently to stretch fingers. It was noted that fingers were not as tight and difficult to stretch out during session. Pt again completed mirror therapy and therapist noticed  patient performed composite extension of all fingers approx. 1 cm.    Plan Cont with weightbearing in quadraped and mirror therapy. Work on increasing active movement of LUE.         Problem List Patient Active Problem List   Diagnosis Date Noted  . Seizures 12/02/2012  . AVM (arteriovenous malformation) 12/02/2012    Limmie Patricia, OTR/L,CBIS  [phone]  09/13/2014, 4:28 PM  [facility] [address] [place], [place], [address] Phone: [phone]   Fax:  [phone]

## 2014-09-15 ENCOUNTER — Encounter (HOSPITAL_COMMUNITY): Payer: Self-pay | Admitting: Occupational Therapy

## 2014-09-15 ENCOUNTER — Ambulatory Visit (HOSPITAL_COMMUNITY): Payer: Federal, State, Local not specified - PPO | Admitting: Physical Therapy

## 2014-09-15 ENCOUNTER — Ambulatory Visit (HOSPITAL_COMMUNITY): Payer: Federal, State, Local not specified - PPO | Admitting: Occupational Therapy

## 2014-09-15 DIAGNOSIS — S43112S Subluxation of left acromioclavicular joint, sequela: Secondary | ICD-10-CM

## 2014-09-15 DIAGNOSIS — R262 Difficulty in walking, not elsewhere classified: Secondary | ICD-10-CM

## 2014-09-15 DIAGNOSIS — IMO0002 Reserved for concepts with insufficient information to code with codable children: Secondary | ICD-10-CM

## 2014-09-15 DIAGNOSIS — R29898 Other symptoms and signs involving the musculoskeletal system: Secondary | ICD-10-CM

## 2014-09-15 DIAGNOSIS — I69354 Hemiplegia and hemiparesis following cerebral infarction affecting left non-dominant side: Secondary | ICD-10-CM | POA: Diagnosis not present

## 2014-09-15 DIAGNOSIS — R269 Unspecified abnormalities of gait and mobility: Secondary | ICD-10-CM

## 2014-09-15 DIAGNOSIS — G8110 Spastic hemiplegia affecting unspecified side: Secondary | ICD-10-CM

## 2014-09-15 DIAGNOSIS — R2689 Other abnormalities of gait and mobility: Secondary | ICD-10-CM

## 2014-09-15 NOTE — Patient Instructions (Signed)
Strengthening: Quadriceps Set   Tighten muscles on top of thighs by pushing knees down into surface. Hold ____ seconds. Repeat ____ times per set. Do ____ sets per session. Do ____ sessions per day.  http://orth.exer.us/602   Copyright  VHI. All rights reserved.  Strengthening: Hamstring Set   With left foot turned in, tighten muscles on back of thigh by pulling heel down into surface. Hold ____ seconds. Repeat ____ times per set. Do ____ sets per session. Do ____ sessions per day.  http://orth.exer.us/604   Copyright  VHI. All rights reserved.  Strengthening: Terminal Knee Extension (Supine)   With right knee over bolster, straighten knee by tightening muscles on top of thigh. Keep bottom of knee on bolster. Repeat ____ times per set. Do ____ sets per session. Do ____ sessions per day.  http://orth.exer.us/626   Copyright  VHI. All rights reserved.  Bridging   Slowly raise buttocks from floor, with right leg straight keeping stomach tight. Repeat ____ times per set. Do ____ sets per session. Do ____ sessions per day.  http://orth.exer.us/1096   Copyright  VHI. All rights reserved.  Bent Leg Lift (Hook-Lying)   Tighten stomach and slowly raise right leg ____ inches from floor. Keep trunk rigid. Hold ____ seconds. Repeat ____ times per set. Do ____ sets per session. Do ____ sessions per day.  http://orth.exer.us/1090   Copyright  VHI. All rights reserved.  Strengthening: Hip Abduction (Side-Lying)   Tighten muscles on front of left thigh, then lift leg ____ inches from surface, keeping knee locked.  Repeat ____ times per set. Do ____ sets per session. Do ____ sessions per day.  http://orth.exer.us/622   Copyright  VHI. All rights reserved.

## 2014-09-15 NOTE — Therapy (Signed)
[place] [facility] [address] [place], [place], [address] Phone: [phone]   Fax:  [phone]  Physical Therapy Treatment  Patient Details  Name: Jennifer Pope MRN: [id number] Date of Birth: Aug 01, 1970 Referring Provider:  Cassell Clement, MD  Encounter Date: 09/15/2014      PT End of Session - 09/15/14 1624    Visit Number 10   Number of Visits 16   Date for PT Re-Evaluation 10/11/14   Authorization Type BCBS   Authorization - Visit Number 10   Authorization - Number of Visits 16   PT Start Time 1520   PT Stop Time 1620   PT Time Calculation (min) 60 min   Equipment Utilized During Treatment Gait belt   Activity Tolerance Patient limited by fatigue   Behavior During Therapy [facility] for tasks assessed/performed      Past Medical History  Diagnosis Date  . Seizures   . Arteriovenous malformation   . Stroke     Past Surgical History  Procedure Laterality Date  . Ankle fracture surgery Right     2002    There were no vitals filed for this visit.  Visit Diagnosis:  Weakness due to cerebrovascular accident  Decreased functional mobility  Difficulty walking  Abnormality of gait  Left leg weakness  Spastic hemiplegia affecting nondominant side      Subjective Assessment - 09/15/14 1524    Subjective Pt states she is doing her exercises once a day.  Encouraged pt to complete exercises 3 x a day    Currently in Pain? No/denies            [facility] PT Assessment - 09/15/14 1617    Strength   Left Hip Flexion 3-/5   Left Hip Extension 3-/5   Left Hip ABduction 2+/5   Left Knee Flexion 2-/5   Left Knee Extension 2/5   Left Ankle Dorsiflexion 2-/5   Left Ankle Plantar Flexion 2-/5            [facility] Adult PT Treatment/Exercise - 09/15/14 0001    Knee/Hip Exercises: Aerobic   Stationary Bike nustep L2 x 10:00  Level I; green t-band around thighs    Knee/Hip Exercises: Standing   Heel Raises 10 reps   Functional Squat 10 reps   Other Standing Knee Exercises side step at mat x 2 RT    Other Standing Knee Exercises marching x 10    Knee/Hip Exercises: Seated   Other Seated Knee/Hip Exercises sit to stand x 10    Knee/Hip Exercises: Sidelying   Other Sidelying Knee/Hip Exercises knee flexion/extension x10 on blue wedge for assist                 PT Education - 09/15/14 1623    Education provided Yes   Education Details new HEP   Person(s) Educated Patient   Methods Explanation;Handout;Verbal cues;Tactile cues   Comprehension Verbalized understanding;Returned demonstration          PT Short Term Goals - 09/08/14 1523    PT SHORT TERM GOAL #1   Title Patient will demosntrate increased ankel dorsiflexion strength of 2/5 MMT to decrease risk of trippign over Lt foot due to Lt foot drop.   Status On-going   PT SHORT TERM GOAL #2   Title Patient will demonstrate increased Lt hamstring/calf strength of 4-/5 MMT to decrease risk of Lt knee hyper extension.    Status On-going   PT SHORT TERM GOAL #3   Title  Patient will dmeosntrate increased Lt hip flexion of 4/5 MMT to be able to ambulate with improved stride length.    Status On-going   PT SHORT TERM GOAL #4   Title patient will be abel to ambualte with a SPC for 100 ft   Status On-going   PT SHORT TERM GOAL #5   Title I with HEP for progression of strength   Baseline 09/08/2014 Reports compliance with HEP daily   Status On-going           PT Long Term Goals - 09/08/14 1545    PT LONG TERM GOAL #1   Title Patient will demonstrate increased ankle dorsiflexion strength of 3/5 MMT to decrease risk of tripping over Lt foot due to Lt foot drop   Status On-going   PT LONG TERM GOAL #2   Title Patient will demonstrate increased Lt hamstring/calf strength of 5/5 MMT to decrease risk of Lt knee hyper extension.    Status On-going   PT LONG TERM GOAL #3   Title Patient will demosntrate increased Lt hip extension of 4/5 MMT  to be able to ambulate with improved stride length and perofmr sit to stand without dependence of Rt LE for perofrmance of sit to stand.    Status On-going   PT LONG TERM GOAL #4   Title Patient will be able to ambulate up and down stairs with only 1 HHA.    Status On-going   PT LONG TERM GOAL #5   Title Patient will demonstrate increased abdominal strength of 5/5 MMT to be able ride horses.                Plan - 09/15/14 1624    Clinical Impression Statement Pt worked on tall kneeling as well well as concentrating on more weight bearing activity.  Pt encouraged to walk and complete exercises more than once a day.  Nu-step added to pt program to increase activity tolerance.  Reccomend  three times a week treatment     PT Frequency 3x / week   PT Duration 8 weeks   PT Treatment/Interventions Therapeutic exercise;Balance training;Neuromuscular re-education;Patient/family education;Gait training;Manual techniques;Functional mobility training;Therapeutic activities   PT Next Visit Plan Increase to three times a week.   Trial of Russian stim on hamstrings to improve functional contraction.         Problem List Patient Active Problem List   Diagnosis Date Noted  . Seizures 12/02/2012  . AVM (arteriovenous malformation) 12/02/2012   Virgina Organ, PT CLT [phone] 09/15/2014, 4:30 PM  [facility] [address] [place], [place], [address] Phone: [phone]   Fax:  [phone]

## 2014-09-15 NOTE — Therapy (Signed)
[facility] [facility] [address] [place], [place], [address] Phone: [phone]   Fax:  [phone]  Occupational Therapy Treatment  Patient Details  Name: Jennifer Pope MRN: [id number] Date of Birth: 1970/05/21 Referring Provider:  Cassell Clement, MD  Encounter Date: 09/15/2014      OT End of Session - 09/15/14 1625    Visit Number 14   Number of Visits 36   Date for OT Re-Evaluation 10/10/14   Authorization Type n/a   OT Start Time 1435   OT Stop Time 1515   OT Time Calculation (min) 40 min   Activity Tolerance Patient tolerated treatment well   Behavior During Therapy [facility] for tasks assessed/performed      Past Medical History  Diagnosis Date  . Seizures   . Arteriovenous malformation   . Stroke     Past Surgical History  Procedure Laterality Date  . Ankle fracture surgery Right     2002    There were no vitals filed for this visit.  Visit Diagnosis:  Subluxation of left acromioclavicular joint, sequela  Weakness due to cerebrovascular accident  Spastic hemiplegia affecting nondominant side      Subjective Assessment - 09/15/14 1619    Subjective  S: This sublux thing is making my shoulder hurt.    Currently in Pain? 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 Helmet on at all times when ambulating.                   OT Treatments/Exercises (OP) - 09/15/14 1620    Exercises   Exercises Neurological Re-education   Visual/Perceptual Exercises   Other Exercises Mirror therapy completed with LUE to cause motor activity in the affected side of the brain, providing visual input and stimulating the  brain    Neurological Re-education Exercises   Weight Bearing Position Quadraped   Other Weight-Bearing Exercises 1 Quadraped with weight on palms. Used blue foam cushion for left hand to increase comfort. Max A at elbow to maintain proper position of LUE and prevent buckling. Two 5 minute trials completed. Pt transitioned from tall kneeling down to quadraped. Periodically during weightbearing patient raised right hand to increase weight on LUE.    Weight Shifting Other   Weight-Shifting Exercises - Other Weightshifting back and forth in quadruped position with Max A at elbow to prevent buckling.    Weight Bearing Technique   Weight Bearing Technique Yes            OT Short Term Goals - 09/10/14 1509    OT SHORT TERM GOAL #1   Title Patient will be educated on a HEP.   Time 6   Period Weeks   Status On-going   OT SHORT TERM GOAL #2   Title Patient will use her left arm as a gross assist with daily activites.   Time 6   Period Weeks   Status On-going   OT SHORT TERM GOAL #3   Title Patient will decrease subluxation in left shoulder to 1.5 digits for decreased pain with shoulder use.  Time 6   Period Weeks   Status On-going   OT SHORT TERM GOAL #4   Title Patient will weightbear on her left arm with max pa while competing functional activiites.    Time 6   Period Weeks   Status On-going   OT SHORT TERM GOAL #5   Title Patient will have trace mobility in her LUE for increased ability to complete BADLs.   Time 6   Period Weeks   OT SHORT TERM GOAL #6   Title Patient will complete bathing and dressing with close min guard.    Period Weeks   OT SHORT TERM GOAL #7   Title Patient will attend to left side during ADLs and functional ambulation 50% of time.    Period Weeks           OT Long Term Goals - 09/10/14 1509    OT LONG TERM GOAL #1   Title Patient will use her left arm as an active assist with daily tasks.   Time 12   Period Weeks   Status On-going   OT LONG  TERM GOAL #2   Title Patient will have 50% volitional movement in her left arm.   Time 12   Period Weeks   Status On-going   OT LONG TERM GOAL #3   Title Patient will be able to weightbear with min pa or less.   Time 12   Period Weeks   Status On-going   OT LONG TERM GOAL #4   Title Patient will decrease subluxation to 1 digit or less in right shoulder region.    Time 12   Period Weeks   Status On-going   OT LONG TERM GOAL #5   Title Patient will attend to left independently.   Time 12   Period Weeks   Status On-going   OT LONG TERM GOAL #6   Title Patient will improve to Brunnstrom stage IV in LUE.   Time 12   Period Weeks   Status On-going   OT LONG TERM GOAL #7   Title Patient wll complete all ADLs and ambulation with Mod I.   Period Weeks               Plan - 09/15/14 1625    Clinical Impression Statement A: Pt wore finger extension splint to session and reports it helps to stretch her finger out, although pt felt fingers were tighter during this session versus previous session. Pt completed mirror therapy and weightbearing this session. OTR noted pt did have minimal movement in her thumb intermittantly during mirror therapy. Pt reports some discomfort in shoulder at home & is wearing sling to decrease pain at times.     Plan P: Work on LUE range of motion-PROM & AAROM.         Problem List Patient Active Problem List   Diagnosis Date Noted  . Seizures 12/02/2012  . AVM (arteriovenous malformation) 12/02/2012    Ezra Sites, OTR/L  [phone]  09/15/2014, 4:31 PM  [place] [facility] [address] [place], [place], [address] Phone: [phone]   Fax:  [phone]

## 2014-09-17 ENCOUNTER — Ambulatory Visit (HOSPITAL_COMMUNITY): Payer: Federal, State, Local not specified - PPO | Admitting: Occupational Therapy

## 2014-09-17 ENCOUNTER — Encounter (HOSPITAL_COMMUNITY): Payer: Self-pay | Admitting: Occupational Therapy

## 2014-09-17 DIAGNOSIS — G8110 Spastic hemiplegia affecting unspecified side: Secondary | ICD-10-CM

## 2014-09-17 DIAGNOSIS — S43112S Subluxation of left acromioclavicular joint, sequela: Secondary | ICD-10-CM

## 2014-09-17 DIAGNOSIS — I69354 Hemiplegia and hemiparesis following cerebral infarction affecting left non-dominant side: Secondary | ICD-10-CM | POA: Diagnosis not present

## 2014-09-17 NOTE — Therapy (Signed)
[facility] [address] [place], [place], [address] Phone: [phone]   Fax:  [phone]  Occupational Therapy Treatment  Patient Details  Name: Jennifer Pope MRN: [id number] Date of Birth: 03-28-1970 Referring Provider:  Cassell Clement, MD  Encounter Date: 09/17/2014      OT End of Session - 09/17/14 1621    Visit Number 15   Number of Visits 36   Date for OT Re-Evaluation 10/10/14   Authorization Type n/a   OT Start Time 1346   OT Stop Time 1434   OT Time Calculation (min) 48 min   Activity Tolerance Patient tolerated treatment well   Behavior During Therapy [facility] for tasks assessed/performed      Past Medical History  Diagnosis Date  . Seizures   . Arteriovenous malformation   . Stroke     Past Surgical History  Procedure Laterality Date  . Ankle fracture surgery Right     2002    There were no vitals filed for this visit.  Visit Diagnosis:  Spastic hemiplegia affecting nondominant side  Subluxation of left acromioclavicular joint, sequela      Subjective Assessment - 09/17/14 1348    Subjective  S: I guess I got turned wrong and couldn't get my arm out from under me this morning. It was really hurting after that.    Currently in Pain? 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 Helmet on at all times when ambulating.                   OT Treatments/Exercises (OP) - 09/17/14 1615    Bed Mobility   Bed Mobility Sit to Supine;Supine to Sit   Supine to Sit 6: Modified independent (Device/Increase time)   Sit to Supine 6: Modified independent (Device/Increase time)   Exercises   Exercises Neurological Re-education   Neurological Re-education Exercises   Shoulder Flexion PROM;AAROM;10 reps   Shoulder ABduction PROM;10 reps   Shoulder Protraction PROM;AAROM;10 reps   Shoulder Horizontal ABduction PROM;10 reps   Shoulder External Rotation PROM;AAROM;10 reps   Shoulder Internal Rotation PROM;AAROM;10 reps   Elbow Flexion PROM;AAROM;10 reps   Elbow Extension PROM;AAROM;10 reps   Forearm Supination PROM;10 reps   Forearm Pronation PROM;10 reps   Wrist Flexion PROM;10 reps   Wrist Extension PROM;10 reps   Finger Extension PROM of digits to decrease spasticity    Other Grasp and Release Exercises  Pt completed active left hand gross grasp to maintain hold on dowel rod. Min difficulty to grasp in pronation, max difficulty in supination.    Manual Therapy   Manual Therapy Myofascial release   Myofascial Release Myofascial release to left upper arm, deltoid, trapezius, and scapularis regions to decrease fascial restrictions, decrease pain, and increase joint mobility during ROM exercises            OT Short Term Goals - 09/10/14 1509    OT SHORT TERM GOAL #1   Title Patient will be educated on a HEP.   Time 6   Period Weeks   Status On-going  OT SHORT TERM GOAL #2   Title Patient will use her left arm as a gross assist with daily activites.   Time 6   Period Weeks   Status On-going   OT SHORT TERM GOAL #3   Title Patient will decrease subluxation in left shoulder to 1.5 digits for decreased pain with shoulder use.   Time 6   Period Weeks   Status On-going   OT SHORT TERM GOAL #4   Title Patient will weightbear on her left arm with max pa while competing functional activiites.    Time 6   Period Weeks   Status On-going   OT SHORT TERM GOAL #5   Title Patient will have trace mobility in her LUE for increased ability to complete BADLs.   Time 6   Period Weeks   OT SHORT TERM GOAL #6   Title Patient will complete bathing and dressing with close min  guard.    Period Weeks   OT SHORT TERM GOAL #7   Title Patient will attend to left side during ADLs and functional ambulation 50% of time.    Period Weeks           OT Long Term Goals - 09/10/14 1509    OT LONG TERM GOAL #1   Title Patient will use her left arm as an active assist with daily tasks.   Time 12   Period Weeks   Status On-going   OT LONG TERM GOAL #2   Title Patient will have 50% volitional movement in her left arm.   Time 12   Period Weeks   Status On-going   OT LONG TERM GOAL #3   Title Patient will be able to weightbear with min pa or less.   Time 12   Period Weeks   Status On-going   OT LONG TERM GOAL #4   Title Patient will decrease subluxation to 1 digit or less in right shoulder region.    Time 12   Period Weeks   Status On-going   OT LONG TERM GOAL #5   Title Patient will attend to left independently.   Time 12   Period Weeks   Status On-going   OT LONG TERM GOAL #6   Title Patient will improve to Brunnstrom stage IV in LUE.   Time 12   Period Weeks   Status On-going   OT LONG TERM GOAL #7   Title Patient wll complete all ADLs and ambulation with Mod I.   Period Weeks               Plan - 09/17/14 1622    Clinical Impression Statement A: Completed myofascial release, PROM, and AAROM exercises with LUE this session. Pt reports increased pain and discomfort around shoulder and upper arm, OT noted increased fascial restrictions at anterior & medial deltoid as well as lower trapezius region. Pt completed AAROM exercises with dowel rod this session. Pt able to grasp rod with min difficulty, mod-max difficulty with extension to release rod. Pt able to complete AAROM exercises-protraction, flexion, IR/ER, and elbow flexion/extension in 2 sets of 5, with mod difficutly, OT providing min assist at elbow and min guard at wrist. OT noted increased tricep contraction and elbow extension this session. Pt took intermittant rest breaks due to fatigue.     Plan P: Continue working on LUE functional use and ROM, grasp & release.         Problem List Patient Active Problem List  Diagnosis Date Noted  . Seizures 12/02/2012  . AVM (arteriovenous malformation) 12/02/2012    Ezra Sites, OTR/L  [phone]  09/17/2014, 4:28 PM  [facility] [address] [place], [place], [address] Phone: [phone]   Fax:  [phone]

## 2014-09-20 ENCOUNTER — Encounter (HOSPITAL_COMMUNITY): Payer: Self-pay

## 2014-09-20 ENCOUNTER — Ambulatory Visit (HOSPITAL_COMMUNITY): Payer: Federal, State, Local not specified - PPO

## 2014-09-20 ENCOUNTER — Ambulatory Visit (HOSPITAL_COMMUNITY): Payer: Federal, State, Local not specified - PPO | Admitting: Speech Pathology

## 2014-09-20 ENCOUNTER — Ambulatory Visit (HOSPITAL_COMMUNITY): Payer: Federal, State, Local not specified - PPO | Admitting: Physical Therapy

## 2014-09-20 DIAGNOSIS — G8110 Spastic hemiplegia affecting unspecified side: Secondary | ICD-10-CM

## 2014-09-20 DIAGNOSIS — R262 Difficulty in walking, not elsewhere classified: Secondary | ICD-10-CM

## 2014-09-20 DIAGNOSIS — IMO0002 Reserved for concepts with insufficient information to code with codable children: Secondary | ICD-10-CM

## 2014-09-20 DIAGNOSIS — I69354 Hemiplegia and hemiparesis following cerebral infarction affecting left non-dominant side: Secondary | ICD-10-CM | POA: Diagnosis not present

## 2014-09-20 DIAGNOSIS — R29898 Other symptoms and signs involving the musculoskeletal system: Secondary | ICD-10-CM

## 2014-09-20 DIAGNOSIS — I69119 Unspecified symptoms and signs involving cognitive functions following nontraumatic intracerebral hemorrhage: Secondary | ICD-10-CM

## 2014-09-20 DIAGNOSIS — R269 Unspecified abnormalities of gait and mobility: Secondary | ICD-10-CM

## 2014-09-20 NOTE — Therapy (Signed)
[facility] [facility] [address] [place], [place], [address] Phone: [phone]   Fax:  [phone]  Physical Therapy Treatment  Patient Details  Name: Jennifer Pope MRN: [id number] Date of Birth: Jun 01, 1970 Referring Provider:  Derrell Lollingrenstein, Raphael, MD  Encounter Date: 09/20/2014      PT End of Session - 09/20/14 1708    Visit Number 11   Number of Visits 16   Date for PT Re-Evaluation 10/11/14   Authorization Type BCBS   Authorization - Visit Number 11   Authorization - Number of Visits 16   PT Start Time 1515   PT Stop Time 1600   PT Time Calculation (min) 45 min   Equipment Utilized During Treatment Gait belt   Activity Tolerance Patient tolerated treatment well      Past Medical History  Diagnosis Date  . Seizures   . Arteriovenous malformation   . Stroke     Past Surgical History  Procedure Laterality Date  . Ankle fracture surgery Right     2002    There were no vitals filed for this visit.  Visit Diagnosis:  Spastic hemiplegia affecting nondominant side  Weakness due to cerebrovascular accident  Difficulty walking  Abnormality of gait  Left leg weakness      Subjective Assessment - 09/20/14 1703    Subjective Pt states she has been trying to do her exercises on a more frequent basis    Currently in Pain? No/denies                [facility] Adult PT Treatment/Exercise - 09/20/14 1704    Bed Mobility   Bed Mobility Sit to Supine;Supine to Sit   Supine to Sit 6: Modified independent (Device/Increase time)   Sit to Supine 6: Modified independent (Device/Increase time)   Ambulation/Gait   Ambulation/Gait Yes   Ambulation/Gait Assistance 4: Min guard  concentrating on equal stride lengths.    Assistive device Hemi-walker;   Elbow Exercises   Elbow Flexion --   Elbow Extension --   Forearm Supination --   Forearm Pronation --   Wrist Flexion --   Wrist Extension --   Knee/Hip Exercises: Standing    Heel Raises 10 reps   Functional Squat 10 reps   Other Standing Knee Exercises side step at mat x 2 RT    Other Standing Knee Exercises marching x 10    Knee/Hip Exercises: Seated   Other Seated Knee/Hip Exercises sit to stand x 10    Knee/Hip Exercises: Sidelying   Other Sidelying Knee/Hip Exercises knee flexion/extension x10 on blue wedge for assist    Knee/Hip Exercises: Prone   Other  Exercises tall kneeling walking forward and back x 3; PNF pattern with Lt UE from mat to ceiling, supine Lt leg bridge only x 10                             PT Education - 09/20/14 1708    Education provided Yes   Education Details equalized stride when ambulating   Person(s) Educated Patient;Spouse   Methods Explanation   Comprehension Verbalized understanding;Returned demonstration          PT Short Term Goals - 09/08/14 1523    PT SHORT TERM GOAL #1   Title Patient will demosntrate increased ankel dorsiflexion strength of 2/5 MMT to decrease risk of trippign over Lt foot due to Lt foot drop.   Status On-going  PT SHORT TERM GOAL #2   Title Patient will demonstrate increased Lt hamstring/calf strength of 4-/5 MMT to decrease risk of Lt knee hyper extension.    Status On-going   PT SHORT TERM GOAL #3   Title Patient will dmeosntrate increased Lt hip flexion of 4/5 MMT to be able to ambulate with improved stride length.    Status On-going   PT SHORT TERM GOAL #4   Title patient will be abel to ambualte with a SPC for 100 ft   Status On-going   PT SHORT TERM GOAL #5   Title I with HEP for progression of strength   Baseline 09/08/2014 Reports compliance with HEP daily   Status On-going           PT Long Term Goals - 09/08/14 1545    PT LONG TERM GOAL #1   Title Patient will demonstrate increased ankle dorsiflexion strength of 3/5 MMT to decrease risk of tripping over Lt foot due to Lt foot drop   Status On-going   PT LONG TERM GOAL #2   Title Patient will demonstrate  increased Lt hamstring/calf strength of 5/5 MMT to decrease risk of Lt knee hyper extension.    Status On-going   PT LONG TERM GOAL #3   Title Patient will demosntrate increased Lt hip extension of 4/5 MMT to be able to ambulate with improved stride length and perofmr sit to stand without dependence of Rt LE for perofrmance of sit to stand.    Status On-going   PT LONG TERM GOAL #4   Title Patient will be able to ambulate up and down stairs with only 1 HHA.    Status On-going   PT LONG TERM GOAL #5   Title Patient will demonstrate increased abdominal strength of 5/5 MMT to be able ride horses.                Plan - 09/20/14 1708    Clinical Impression Statement Pt able to complete hamstring curl and walk backwards while tall kneeling with greater ease today.  Pt continues to need verbal and manual cuing to keep wt on Lt LE.  Unable to complete russian stim secondary to seizures.    PT Next Visit Plan continue with nustep begin sidestepping while in tall kneeling.         Problem List Patient Active Problem List   Diagnosis Date Noted  . Seizures 12/02/2012  . AVM (arteriovenous malformation) 12/02/2012   Virgina Organ, PT CLT [phone] 09/20/2014, 5:11 PM  [facility] [facility] [address] [place], [place], [address] Phone: [phone]   Fax:  [phone]

## 2014-09-20 NOTE — Therapy (Signed)
[facility] [address] [place], [place], [address] Phone: [phone]   Fax:  [phone]  Speech Language Pathology Treatment  Patient Details  Name: Jennifer Pope MRN: [id number] Date of Birth: 1970/07/09 Referring Provider:  Derrell Lolling, MD  Encounter Date: 09/20/2014      End of Session - 09/20/14 1659    Visit Number 3   Number of Visits 16   Date for SLP Re-Evaluation 10/11/14   Authorization Type BCBS federal   Authorization Time Period 08/11/2014-10/11/2014   Authorization - Visit Number 3   Authorization - Number of Visits 16   SLP Start Time 1348   SLP Stop Time  1432   SLP Time Calculation (min) 44 min   Activity Tolerance Patient tolerated treatment well      Past Medical History  Diagnosis Date  . Seizures   . Arteriovenous malformation   . Stroke     Past Surgical History  Procedure Laterality Date  . Ankle fracture surgery Right     2002    There were no vitals filed for this visit.  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 She was emergently taken to OR for hemicraniotomy for evacuation of hematoma, subsequently she underwent trach and PEG placement which have since been removed. 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 The bone flap is currently being stored until swelling has subsided and pt expects this will be replaced in 6-8 months. She was on a modified  diet for some time, but was discharged from [facility] on a regular diet with thin liquids (no longer needs to utilize chin tuck). Pt made tremendous progress while at [facility] and was discharged with mild cognitive deficits (previously mod/severe) and dysphonia. She is referred for outpatient SLP therapy to address residual deficits and increase independence/maximize functional return. She has good family support from her husband.    Treatment Provided   Treatment provided Cognitive-Linquistic   Pain Assessment   Pain Assessment No/denies pain   Cognitive-Linquistic Treatment   Treatment focused on Cognition;Patient/family/caregiver education   Skilled Treatment problem solving, deduction and organization skills   Assessment / Recommendations / Plan   Plan Continue with current plan of care            SLP Short Term Goals - 09/20/14 1708    SLP SHORT TERM GOAL #1   Title Pt will increase maximum exhalation time to 15 seconds with use of breath support exercises and mild/mod cues   Baseline 6 seconds   Time 4   Period Weeks   Status On-going   SLP SHORT TERM GOAL #2   Title Pt will complete moderate level working memory tasks with 95% acc with use of compensatory strategies with mild/mod cues.   Baseline 75%   Time 4   Period Weeks   Status On-going   SLP SHORT TERM GOAL #3   Title Pt will be  oriented to time and complex situations with 100% acc with use of calendar/planner and min cues.   Baseline 75%   Time 4   Period Weeks   Status On-going   SLP SHORT TERM GOAL #4   Title Pt will increase vocal intensity to [facility] for small group setting with use of compensatory strategies and mild/mod cues from SLP.   Baseline mild/mod impairment   Time 4   Period Weeks   Status On-going          SLP Long Term Goals - 09/20/14 1708    SLP LONG TERM GOAL #1   Title Pt will demonstrate improved phonation to [facility] for small group setting with use of strategies.   Baseline  mild/mod impairment   Time 8   Period Weeks   Status On-going   SLP LONG TERM GOAL #2   Title Pt will increase attention and memory skills to [facility] for independent environment with use of compensatory strategies as needed.    Baseline supervision   Time 8   Period Weeks   Status On-going          Plan - 09/20/14 1700    Clinical Impression Statement Jennifer Pope was accompanied by her husband for treatment today. She had difficulty with homework (basic level deduction puzzles), but forgot to bring them in. SLP explained rationale for completing these activities: breaking down an overwhelming problem into smaller parts, organizing, and planning to complete. She benefitted from step by step cuing initially with recommendations to write down information and cross out information as she moves along. She became "lost" a couple of times, but was easily redirected to task with use of paper to cover the rest of the written material. She was asked to complete previously given deduction puzzles for homework since she now seems to have a better plan for completion. Next session, continue with higher level deductive reasoning and organization tasks.   Speech Therapy Frequency 2x / week   Duration --  8 weeks   Treatment/Interventions Cueing hierarchy;Compensatory techniques;Cognitive reorganization;Internal/external aids;Oral motor exercises;SLP instruction and feedback;Patient/family education;Compensatory strategies   Potential to Achieve Goals Good   SLP Home Exercise Plan Pt will be independent with HEP to facilitate carryover of treatment strategies in home/community environment.   Consulted and Agree with Plan of Care Patient;Family member/caregiver   Family Member Consulted husband, Derrick        Problem List Patient Active Problem List   Diagnosis Date Noted  . Seizures 12/02/2012  . AVM (arteriovenous malformation) 12/02/2012   Thank you,  Havery Moros,  CCC-SLP [phone]  Braniyah Besse 09/20/2014, 5:09 PM  [facility] [address] [place], [place], [address] Phone: [phone]   Fax:  [phone]

## 2014-09-20 NOTE — Therapy (Signed)
[facility] [address] [place], [place], [address] Phone: [phone]   Fax:  [phone]  Occupational Therapy Treatment  Patient Details  Name: HANEEN RATTS MRN: [id number] Date of Birth: 01/17/1971 Referring Provider:  Derrell Lolling, MD  Encounter Date: 09/20/2014      OT End of Session - 09/20/14 1653    Visit Number 16   Number of Visits 36   Date for OT Re-Evaluation 10/10/14   Authorization Type n/a   OT Start Time 1435   OT Stop Time 1515   OT Time Calculation (min) 40 min   Activity Tolerance Patient tolerated treatment well   Behavior During Therapy [facility] for tasks assessed/performed      Past Medical History  Diagnosis Date  . Seizures   . Arteriovenous malformation   . Stroke     Past Surgical History  Procedure Laterality Date  . Ankle fracture surgery Right     2002    There were no vitals filed for this visit.  Visit Diagnosis:  Spastic hemiplegia affecting nondominant side  Weakness due to cerebrovascular accident      Subjective Assessment - 09/20/14 1651    Subjective  S: I was able to do these movements last time no problem so I don't know why it's so difficult today.   Currently in Pain? Yes   Pain Score 4    Pain Location Shoulder   Pain Orientation Left   Pain Descriptors / Indicators Sore   Pain Type Acute pain            [facility] OT Assessment - 09/20/14 1456    Assessment   Diagnosis ICH secondary to AVM with left side hemiparesis   Precautions   Precautions Fall;Other (comment)   Precaution Comments left side neglect. Helmet on at all times when ambulating.                   OT Treatments/Exercises (OP) - 09/20/14 1456    Exercises   Exercises Shoulder;Neurological Re-education   Shoulder Exercises: Supine   Protraction AAROM;10 reps   Horizontal ABduction AAROM;10 reps   External Rotation AAROM;10 reps   External Rotation Limitations hand over hand   Internal Rotation AAROM;10 reps   Flexion AAROM;10 reps   ABduction AAROM;10 reps;Limitations   ABduction Limitations Hand over hand   Neurological Re-education Exercises   Elbow Extension PROM;AAROM;10 reps   Finger Flexion PROM, AROM 10X   Finger Extension PROM 10X   Other Grasp and Release Exercises  Pt completed active left hand gross grasp to maintain hold on dowel rod. Min difficulty to grasp in pronation, max difficulty in supination.    Manual Therapy   Manual Therapy Myofascial release   Myofascial Release Myofascial release to left upper arm, deltoid, trapezius, and scapularis regions to decrease fascial restrictions, decrease pain, and increase joint mobility during ROM exercises                   OT Short Term Goals - 09/10/14 1509    OT SHORT TERM GOAL #1   Title Patient will be educated on a HEP.   Time 6   Period Weeks   Status On-going   OT SHORT TERM GOAL #2   Title Patient will use her left arm as a gross assist with daily activites.   Time 6   Period Weeks   Status On-going   OT SHORT TERM GOAL #3   Title Patient will  decrease subluxation in left shoulder to 1.5 digits for decreased pain with shoulder use.   Time 6   Period Weeks   Status On-going   OT SHORT TERM GOAL #4   Title Patient will weightbear on her left arm with max pa while competing functional activiites.    Time 6   Period Weeks   Status On-going   OT SHORT TERM GOAL #5   Title Patient will have trace mobility in her LUE for increased ability to complete BADLs.   Time 6   Period Weeks   OT SHORT TERM GOAL #6   Title Patient will complete bathing and dressing with close min guard.    Period Weeks   OT SHORT TERM GOAL #7   Title Patient will attend to left side during ADLs and functional ambulation 50% of time.    Period Weeks           OT Long Term Goals - 09/10/14 1509    OT LONG TERM GOAL #1   Title Patient will use her left arm as an active assist with daily tasks.    Time 12   Period Weeks   Status On-going   OT LONG TERM GOAL #2   Title Patient will have 50% volitional movement in her left arm.   Time 12   Period Weeks   Status On-going   OT LONG TERM GOAL #3   Title Patient will be able to weightbear with min pa or less.   Time 12   Period Weeks   Status On-going   OT LONG TERM GOAL #4   Title Patient will decrease subluxation to 1 digit or less in right shoulder region.    Time 12   Period Weeks   Status On-going   OT LONG TERM GOAL #5   Title Patient will attend to left independently.   Time 12   Period Weeks   Status On-going   OT LONG TERM GOAL #6   Title Patient will improve to Brunnstrom stage IV in LUE.   Time 12   Period Weeks   Status On-going   OT LONG TERM GOAL #7   Title Patient wll complete all ADLs and ambulation with Mod I.   Period Weeks               Plan - 09/20/14 1654    Clinical Impression Statement A: Continued with myofascial release and shoulder passive stretching and AAROM exercises supine. Patient stated that it was harder to complete the exercises today versus last session. Pain felt at end stretch in all shoulder ranges. Patient requires min-mod assist to keep left hand gripped to dowel rod.   Plan P: Cont to work on grasp and release during functional task.        Problem List Patient Active Problem List   Diagnosis Date Noted  . Seizures 12/02/2012  . AVM (arteriovenous malformation) 12/02/2012    Limmie Patricia, OTR/L,CBIS  [phone]  09/20/2014, 4:56 PM  [place] [facility] [address] [place], [place], [address] Phone: [phone]   Fax:  [phone]

## 2014-09-22 ENCOUNTER — Ambulatory Visit (HOSPITAL_COMMUNITY): Payer: Federal, State, Local not specified - PPO | Admitting: Occupational Therapy

## 2014-09-22 ENCOUNTER — Ambulatory Visit (HOSPITAL_COMMUNITY): Payer: Federal, State, Local not specified - PPO

## 2014-09-22 ENCOUNTER — Encounter (HOSPITAL_COMMUNITY): Payer: Self-pay | Admitting: Occupational Therapy

## 2014-09-22 DIAGNOSIS — G8110 Spastic hemiplegia affecting unspecified side: Secondary | ICD-10-CM

## 2014-09-22 DIAGNOSIS — R29898 Other symptoms and signs involving the musculoskeletal system: Secondary | ICD-10-CM

## 2014-09-22 DIAGNOSIS — S43112S Subluxation of left acromioclavicular joint, sequela: Secondary | ICD-10-CM

## 2014-09-22 DIAGNOSIS — IMO0002 Reserved for concepts with insufficient information to code with codable children: Secondary | ICD-10-CM

## 2014-09-22 DIAGNOSIS — I69354 Hemiplegia and hemiparesis following cerebral infarction affecting left non-dominant side: Secondary | ICD-10-CM | POA: Diagnosis not present

## 2014-09-22 DIAGNOSIS — R269 Unspecified abnormalities of gait and mobility: Secondary | ICD-10-CM

## 2014-09-22 DIAGNOSIS — R262 Difficulty in walking, not elsewhere classified: Secondary | ICD-10-CM

## 2014-09-22 DIAGNOSIS — R2689 Other abnormalities of gait and mobility: Secondary | ICD-10-CM

## 2014-09-22 NOTE — Therapy (Signed)
[facility] [address] [place], [place], [address] Phone: [phone]   Fax:  [phone]  Occupational Therapy Treatment  Patient Details  Name: Jennifer Pope MRN: [id number] Date of Birth: [birth year] Referring Provider:  Derrell Lollingrenstein, Raphael, MD  Encounter Date: 09/22/2014      OT End of Session - 09/22/14 1526    Visit Number 17   Number of Visits 36   Date for OT Re-Evaluation 10/10/14   Authorization Type n/a   OT Start Time 1430   OT Stop Time 1515   OT Time Calculation (min) 45 min   Activity Tolerance Patient tolerated treatment well   Behavior During Therapy [facility] for tasks assessed/performed      Past Medical History  Diagnosis Date  . Seizures   . Arteriovenous malformation   . Stroke     Past Surgical History  Procedure Laterality Date  . Ankle fracture surgery Right     2002    There were no vitals filed for this visit.  Visit Diagnosis:  Weakness due to cerebrovascular accident  Subluxation of left acromioclavicular joint, sequela  Spastic hemiplegia affecting nondominant side      Subjective Assessment - 09/22/14 1423    Subjective  S: My arm is feeling pretty good today, not much pain.    Currently in Pain? No/denies            [facility] Assessment - 09/22/14 1423    Assessment   Diagnosis ICH secondary to AVM with left side hemiparesis   Precautions   Precautions Fall;Other (comment)   Precaution Comments left side neglect. Helmet on at all times when ambulating.                   OT Treatments/Exercises (OP) - 09/22/14 1520    Exercises   Exercises Neurological Re-education   Neurological Re-education Exercises   Shoulder Flexion PROM;AAROM;10 reps   Shoulder ABduction PROM;10 reps   Shoulder Protraction PROM;AAROM;10 reps   Shoulder Horizontal ABduction PROM;10 reps   Shoulder External Rotation PROM;AAROM;10 reps   Shoulder Internal Rotation PROM;AAROM;10 reps   Elbow Flexion PROM;AAROM;10 reps   Forearm Supination PROM;AROM;10 reps   Forearm Pronation PROM;AROM;10 reps   Wrist Flexion PROM;10 reps   Wrist Extension PROM;10 reps   Other Exercises 1 Pt performed active pronation & supination while performing functional task of folding washcloth. Pt pinched washcloth between thumb & index finger while practicing pronation and supination to fold washcloth. Pt had max difficulty with pronation/supination movements, requiring mod assist from OTR to perform movement and maintain hold. Pt then practiced pronation/supination without washcloth, had mod difficulty. Pt reports she hasn't practiced this movment in approximately 1 week.    Other Grasp and Release Exercises  Pt completed active left hand gross grasp to maintain hold on dowel rod. Min difficulty to grasp in pronation, max difficulty in supination.    Manual Therapy   Manual Therapy Myofascial release   Myofascial Release Myofascial release to left upper arm, deltoid, trapezius, and scapularis regions to decrease fascial restrictions, decrease pain, and increase joint mobility during ROM exercises                   OT Short Term Goals - 09/10/14 1509    OT SHORT TERM GOAL #1   Title Patient will be educated on a HEP.   Time 6   Period Weeks   Status On-going   OT SHORT TERM GOAL #2  Title Patient will use her left arm as a gross assist with daily activites.   Time 6   Period Weeks   Status On-going   OT SHORT TERM GOAL #3   Title Patient will decrease subluxation in left shoulder to 1.5 digits for decreased pain with shoulder use.   Time 6   Period Weeks   Status On-going   OT SHORT TERM GOAL #4   Title Patient will weightbear on her left arm with max pa while competing functional activiites.    Time 6   Period Weeks   Status On-going   OT SHORT TERM GOAL #5   Title Patient will have trace mobility in her LUE for increased ability to complete BADLs.   Time 6   Period Weeks    OT SHORT TERM GOAL #6   Title Patient will complete bathing and dressing with close min guard.    Period Weeks   OT SHORT TERM GOAL #7   Title Patient will attend to left side during ADLs and functional ambulation 50% of time.    Period Weeks           OT Long Term Goals - 09/10/14 1509    OT LONG TERM GOAL #1   Title Patient will use her left arm as an active assist with daily tasks.   Time 12   Period Weeks   Status On-going   OT LONG TERM GOAL #2   Title Patient will have 50% volitional movement in her left arm.   Time 12   Period Weeks   Status On-going   OT LONG TERM GOAL #3   Title Patient will be able to weightbear with min pa or less.   Time 12   Period Weeks   Status On-going   OT LONG TERM GOAL #4   Title Patient will decrease subluxation to 1 digit or less in right shoulder region.    Time 12   Period Weeks   Status On-going   OT LONG TERM GOAL #5   Title Patient will attend to left independently.   Time 12   Period Weeks   Status On-going   OT LONG TERM GOAL #6   Title Patient will improve to Brunnstrom stage IV in LUE.   Time 12   Period Weeks   Status On-going   OT LONG TERM GOAL #7   Title Patient wll complete all ADLs and ambulation with Mod I.   Period Weeks               Plan - 09/22/14 1526    Clinical Impression Statement A: Continued myofascial release, shoulder PROM/AAROM exercises. Pt required min guard for left hand grasping dowel rod. Pt performed functional task of folding washclothes in half using pronation/supination movements and lateral pinch; pt used shoulder elevation to assist in movement. Pt reports she is going to practice supination/pronation at home.    Plan P: Continue working on grasp & release; AAROM exercises. Follow-up on supination/pronation        Problem List Patient Active Problem List   Diagnosis Date Noted  . Seizures 12/02/2012  . AVM (arteriovenous malformation) 12/02/2012    Ezra Sites, OTR/L   [phone]  09/22/2014, 3:31 PM  [facility] [address] [place], [place], [address] Phone: [phone]   Fax:  [phone]

## 2014-09-22 NOTE — Therapy (Signed)
[facility] [address] [place], [place], [address] Phone: [phone]   Fax:  [phone]  Physical Therapy Treatment  Patient Details  Name: Jennifer Pope MRN: [id number] Date of Birth: 01/21/1971 Referring Provider:  Derrell Lollingrenstein, Raphael, MD  Encounter Date: 09/22/2014      PT End of Session - 09/22/14 1557    Visit Number 12   Number of Visits 16   Date for PT Re-Evaluation 10/11/14   Authorization Type BCBS   Authorization - Visit Number 12   Authorization - Number of Visits 16   PT Start Time 1518   PT Stop Time 1605   PT Time Calculation (min) 47 min   Equipment Utilized During Treatment Gait belt   Activity Tolerance Patient limited by fatigue;Patient tolerated treatment well   Behavior During Therapy [facility] for tasks assessed/performed      Past Medical History  Diagnosis Date  . Seizures   . Arteriovenous malformation   . Stroke     Past Surgical History  Procedure Laterality Date  . Ankle fracture surgery Right     2002    There were no vitals filed for this visit.  Visit Diagnosis:  Weakness due to cerebrovascular accident  Difficulty walking  Abnormality of gait  Left leg weakness  Decreased functional mobility      Subjective Assessment - 09/22/14 1519    Subjective Pain free, has been compliance with HEP daily.  Most difficulty currently with keeping Lt toes forward wile walking.   Currently in Pain? No/denies              [facility] Adult PT Treatment/Exercise - 09/22/14 1555    Ambulation/Gait   Ambulation/Gait Yes   Ambulation/Gait Assistance 4: Min guard   Assistive device Hemi-walker;Small based quad cane   Elbow Exercises   Elbow Flexion --   Forearm Supination --   Forearm Pronation --   Wrist Flexion --   Wrist Extension --   Knee/Hip Exercises: Aerobic   Stationary Bike nustep L3 x 10:00   Knee/Hip Exercises: Standing   Other Standing Knee Exercises side step at mat x 2  RT; weight shifting 10 Rt to Lt   Other Standing Knee Exercises marching x 10 x 5"   Knee/Hip Exercises: Seated   Other Seated Knee/Hip Exercises sit to stand x 10    Knee/Hip Exercises: Sidelying   Other Sidelying Knee/Hip Exercises knee flexion/extension x10 on blue wedge for assist    Knee/Hip Exercises: Prone   Other Prone Exercises tall kneeling walking forward and back; sidestepping in tall kneeling x 3; PNF pattern with Lt UE from mat to ceiling                       PT Short Term Goals - 09/22/14 1601    PT SHORT TERM GOAL #1   Title Patient will demosntrate increased ankel dorsiflexion strength of 2/5 MMT to decrease risk of trippign over Lt foot due to Lt foot drop.   Status On-going   PT SHORT TERM GOAL #2   Title Patient will demonstrate increased Lt hamstring/calf strength of 4-/5 MMT to decrease risk of Lt knee hyper extension.    Status On-going   PT SHORT TERM GOAL #3   Title Patient will dmeosntrate increased Lt hip flexion of 4/5 MMT to be able to ambulate with improved stride length.    Status On-going   PT SHORT TERM GOAL #4   Title  patient will be abel to ambualte with a SPC for 100 ft   PT SHORT TERM GOAL #5   Title I with HEP for progression of strength   Status On-going           PT Long Term Goals - 09/22/14 1601    PT LONG TERM GOAL #1   Title Patient will demonstrate increased ankle dorsiflexion strength of 3/5 MMT to decrease risk of tripping over Lt foot due to Lt foot drop   PT LONG TERM GOAL #2   Title Patient will demonstrate increased Lt hamstring/calf strength of 5/5 MMT to decrease risk of Lt knee hyper extension.    PT LONG TERM GOAL #3   Title Patient will demosntrate increased Lt hip extension of 4/5 MMT to be able to ambulate with improved stride length and perofmr sit to stand without dependence of Rt LE for perofrmance of sit to stand.    PT LONG TERM GOAL #4   Title Patient will be able to ambulate up and down stairs with only 1  HHA.    PT LONG TERM GOAL #5   Title Patient will demonstrate increased abdominal strength of 5/5 MMT to be able ride horses.                Plan - 09/22/14 1557    Clinical Impression Statement Increased ease with hamstring curls in sidelying position and walking backwards with less assistance required, began sidestepping in tall kneeling for glut med strengthening.  Pt required multimodal cueing to increase weight bearing on Lt LE.  No reports of pain through session, pt was limited by fatiigue at end of session.     PT Next Visit Plan Continue with gait training, nustep, tall kneelilng forward/backwards and sidestepping, and hamstring curls in sidelying.        Problem List Patient Active Problem List   Diagnosis Date Noted  . Seizures 12/02/2012  . AVM (arteriovenous malformation) 12/02/2012   Juel Burrow, PTA  Juel Burrow 09/22/2014, 4:02 PM  [facility] [facility] [address] [place], [place], [address] Phone: [phone]   Fax:  [phone]

## 2014-09-24 ENCOUNTER — Ambulatory Visit (HOSPITAL_COMMUNITY): Payer: Federal, State, Local not specified - PPO | Attending: Physical Medicine and Rehabilitation

## 2014-09-24 ENCOUNTER — Ambulatory Visit (HOSPITAL_COMMUNITY): Payer: Federal, State, Local not specified - PPO | Admitting: Physical Therapy

## 2014-09-24 ENCOUNTER — Encounter (HOSPITAL_COMMUNITY): Payer: Self-pay

## 2014-09-24 DIAGNOSIS — R490 Dysphonia: Secondary | ICD-10-CM | POA: Insufficient documentation

## 2014-09-24 DIAGNOSIS — I6911 Cognitive deficits following nontraumatic intracerebral hemorrhage: Secondary | ICD-10-CM | POA: Diagnosis present

## 2014-09-24 DIAGNOSIS — S43112S Subluxation of left acromioclavicular joint, sequela: Secondary | ICD-10-CM | POA: Insufficient documentation

## 2014-09-24 DIAGNOSIS — R262 Difficulty in walking, not elsewhere classified: Secondary | ICD-10-CM

## 2014-09-24 DIAGNOSIS — R29898 Other symptoms and signs involving the musculoskeletal system: Secondary | ICD-10-CM

## 2014-09-24 DIAGNOSIS — R2689 Other abnormalities of gait and mobility: Secondary | ICD-10-CM | POA: Insufficient documentation

## 2014-09-24 DIAGNOSIS — R531 Weakness: Secondary | ICD-10-CM | POA: Diagnosis present

## 2014-09-24 DIAGNOSIS — IMO0002 Reserved for concepts with insufficient information to code with codable children: Secondary | ICD-10-CM

## 2014-09-24 DIAGNOSIS — I69898 Other sequelae of other cerebrovascular disease: Secondary | ICD-10-CM | POA: Insufficient documentation

## 2014-09-24 DIAGNOSIS — G8110 Spastic hemiplegia affecting unspecified side: Secondary | ICD-10-CM | POA: Diagnosis present

## 2014-09-24 DIAGNOSIS — R269 Unspecified abnormalities of gait and mobility: Secondary | ICD-10-CM | POA: Diagnosis present

## 2014-09-24 NOTE — Therapy (Signed)
[facility] [address] [place], [place], [address] Phone: [phone]   Fax:  [phone]  Physical Therapy Treatment  Patient Details  Name: Jennifer Pope MRN: [id number] Date of Birth: 1970-07-06 Referring Provider:  Derrell Lolling, MD  Encounter Date: 09/24/2014      PT End of Session - 09/24/14 1203    Visit Number 13   Number of Visits 16   Date for PT Re-Evaluation 10/11/14   Authorization Type BCBS   Authorization - Visit Number 13   Authorization - Number of Visits 16   PT Start Time 1101   PT Stop Time 1144   PT Time Calculation (min) 43 min   Equipment Utilized During Treatment Gait belt   Activity Tolerance Patient tolerated treatment well;Patient limited by fatigue   Behavior During Therapy [facility] for tasks assessed/performed      Past Medical History  Diagnosis Date  . Seizures   . Arteriovenous malformation   . Stroke     Past Surgical History  Procedure Laterality Date  . Ankle fracture surgery Right     2002    There were no vitals filed for this visit.  Visit Diagnosis:  Weakness due to cerebrovascular accident  Difficulty walking  Abnormality of gait  Left leg weakness  Decreased functional mobility      Subjective Assessment - 09/24/14 1103    Subjective Pt denies any pain today, she has been completing her HEP every day.    Currently in Pain? Yes   Pain Score 2    Pain Location Shoulder   Pain Orientation Left              [facility] Adult PT Treatment/Exercise - 09/24/14 0001    Ambulation/Gait   Ambulation/Gait Yes   Ambulation/Gait Assistance 4: Min guard   Ambulation Distance (Feet) 226 Feet  x2   Assistive device Hemi-walker   Gait Pattern Decreased hip/knee flexion - left;Decreased dorsiflexion - left;Left circumduction;Wide base of support;Poor foot clearance - left   Knee/Hip Exercises: Standing   Other Standing Knee Exercises side step at mat x 2 RT; weight  shifting 10 Rt to Lt   Other Standing Knee Exercises marching x 10 x 5"   Knee/Hip Exercises: Seated   Other Seated Knee/Hip Exercises Standing: tap ups at 6 inch step to improve hip/knee flexion   Knee/Hip Exercises: Supine   Heel Slides 10 reps;2 sets   Hip Adduction Isometric 10 reps  3 second hold   Bridges 10 reps   Bridges Limitations single leg bridge   Straight Leg Raises 10 reps   Knee/Hip Exercises: Prone   Other Prone Exercises tall kneeling walking forward and back; sidestepping in tall kneeling x 3; PNF pattern with Lt UE from mat to ceiling                 PT Education - 09/24/14 1202    Education provided Yes   Education Details Pt educated on using step or target at home to practice hip/knee flexion with proper alignment of L foot.    Person(s) Educated Patient;Other (comment)  sister   Methods Explanation;Demonstration   Comprehension Returned demonstration;Verbalized understanding          PT Short Term Goals - 09/22/14 1601    PT SHORT TERM GOAL #1   Title Patient will demosntrate increased ankel dorsiflexion strength of 2/5 MMT to decrease risk of trippign over Lt foot due to Lt foot drop.   Status  On-going   PT SHORT TERM GOAL #2   Title Patient will demonstrate increased Lt hamstring/calf strength of 4-/5 MMT to decrease risk of Lt knee hyper extension.    Status On-going   PT SHORT TERM GOAL #3   Title Patient will dmeosntrate increased Lt hip flexion of 4/5 MMT to be able to ambulate with improved stride length.    Status On-going   PT SHORT TERM GOAL #4   Title patient will be abel to ambualte with a SPC for 100 ft   PT SHORT TERM GOAL #5   Title I with HEP for progression of strength   Status On-going           PT Long Term Goals - 09/22/14 1601    PT LONG TERM GOAL #1   Title Patient will demonstrate increased ankle dorsiflexion strength of 3/5 MMT to decrease risk of tripping over Lt foot due to Lt foot drop   PT LONG TERM GOAL  #2   Title Patient will demonstrate increased Lt hamstring/calf strength of 5/5 MMT to decrease risk of Lt knee hyper extension.    PT LONG TERM GOAL #3   Title Patient will demosntrate increased Lt hip extension of 4/5 MMT to be able to ambulate with improved stride length and perofmr sit to stand without dependence of Rt LE for perofrmance of sit to stand.    PT LONG TERM GOAL #4   Title Patient will be able to ambulate up and down stairs with only 1 HHA.    PT LONG TERM GOAL #5   Title Patient will demonstrate increased abdominal strength of 5/5 MMT to be able ride horses.                Plan - 09/24/14 1204    Clinical Impression Statement Pt had less difficulty with tall kneeling activities in today's treatment, and was able to complete forward walking and sidestepping with CGA while in this position. She continues to require verbal cueing to decrease step length and to prevent abduction of L foot during ambulation. She responded well to tactile faciliation of hamstrings and hip abductors during therex. Heel slides were completed instead of sidelying hamstring curls d/t complaint of headache that is made worse with sidelying.    PT Next Visit Plan Continue with gait training, tall kneelilng forward/backwards and sidestepping, standing hip/knee flexion, and hamstring curls        Problem List Patient Active Problem List   Diagnosis Date Noted  . Seizures 12/02/2012  . AVM (arteriovenous malformation) 12/02/2012    Leona SingletonLauren Derick Seminara, PT, DPT [phone] 09/24/2014, 12:08 PM   [facility] [address] [place], [place], [address] Phone: [phone]   Fax:  [phone]

## 2014-09-24 NOTE — Therapy (Signed)
[facility] [address] [place], [place], [address] Phone: [phone]   Fax:  [phone]  Occupational Therapy Treatment  Patient Details  Name: Jennifer Pope MRN: [id number] Date of Birth: 30-Nov-1970 Referring Provider:  Derrell Lollingrenstein, Raphael, MD  Encounter Date: 09/24/2014      OT End of Session - 09/24/14 1237    Visit Number 18   Number of Visits 36   Date for OT Re-Evaluation 10/10/14   Authorization Type n/a   OT Start Time 1020   OT Stop Time 1100   OT Time Calculation (min) 40 min   Activity Tolerance Patient tolerated treatment well   Behavior During Therapy [facility] for tasks assessed/performed      Past Medical History  Diagnosis Date  . Seizures   . Arteriovenous malformation   . Stroke     Past Surgical History  Procedure Laterality Date  . Ankle fracture surgery Right     2002    There were no vitals filed for this visit.  Visit Diagnosis:  Weakness due to cerebrovascular accident  Subluxation of left acromioclavicular joint, sequela      Subjective Assessment - 09/24/14 1050    Subjective  S: My shoulder feels numb today and I forgot to put my sling on so now it's sore.    Currently in Pain? 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 Helmet on at all times when ambulating.                   OT Treatments/Exercises (OP) - 09/24/14 1051    Exercises   Exercises Neurological Re-education;Shoulder;Elbow   Shoulder Exercises: Supine   Protraction AAROM;10 reps   Horizontal ABduction AAROM;10 reps   External Rotation AAROM;10 reps   External Rotation Limitations hand over hand   Internal Rotation AAROM;10 reps   Flexion AAROM;10 reps   ABduction AAROM;10 reps;Limitations   ABduction Limitations Hand over hand   Shoulder Exercises: Isometric Strengthening   ABduction 3X5"   Elbow Exercises   Other elbow exercises Elbow flexion and extension isometric exercise; 5x5   Manual Therapy   Manual Therapy Myofascial release   Myofascial Release Myofascial release to left upper arm, deltoid, trapezius, and scapularis regions to decrease fascial restrictions, decrease pain, and increase joint mobility during ROM exercises                   OT Short Term Goals - 09/10/14 1509    OT SHORT TERM GOAL #1   Title Patient will be educated on a HEP.   Time 6   Period Weeks   Status On-going   OT SHORT TERM GOAL #2   Title Patient will use her left arm as a gross assist with daily activites.   Time 6   Period Weeks   Status On-going   OT SHORT TERM GOAL #3   Title Patient will decrease subluxation in left shoulder to 1.5 digits for decreased pain with shoulder use.   Time 6   Period Weeks   Status On-going   OT SHORT TERM  GOAL #4   Title Patient will weightbear on her left arm with max pa while competing functional activiites.    Time 6   Period Weeks   Status On-going   OT SHORT TERM GOAL #5   Title Patient will have trace mobility in her LUE for increased ability to complete BADLs.   Time 6   Period Weeks   OT SHORT TERM GOAL #6   Title Patient will complete bathing and dressing with close min guard.    Period Weeks   OT SHORT TERM GOAL #7   Title Patient will attend to left side during ADLs and functional ambulation 50% of time.    Period Weeks           OT Long Term Goals - 09/10/14 1509    OT LONG TERM GOAL #1   Title Patient will use her left arm as an active assist with daily tasks.   Time 12   Period Weeks   Status On-going   OT LONG TERM GOAL #2   Title Patient will have 50% volitional movement in her left arm.   Time 12   Period  Weeks   Status On-going   OT LONG TERM GOAL #3   Title Patient will be able to weightbear with min pa or less.   Time 12   Period Weeks   Status On-going   OT LONG TERM GOAL #4   Title Patient will decrease subluxation to 1 digit or less in right shoulder region.    Time 12   Period Weeks   Status On-going   OT LONG TERM GOAL #5   Title Patient will attend to left independently.   Time 12   Period Weeks   Status On-going   OT LONG TERM GOAL #6   Title Patient will improve to Brunnstrom stage IV in LUE.   Time 12   Period Weeks   Status On-going   OT LONG TERM GOAL #7   Title Patient wll complete all ADLs and ambulation with Mod I.   Period Weeks               Plan - 09/24/14 1237    Clinical Impression Statement A: Added isometrics for eblow flexion/extension and shoulder abduction. Patient reports pain during passive stretching when going past 90 degrees. This session we remained below that pain level when completing AAROM.    Plan P: Continue to work on grasp and release with ES and complete isometric exercises for shoulder.        Problem List Patient Active Problem List   Diagnosis Date Noted  . Seizures 12/02/2012  . AVM (arteriovenous malformation) 12/02/2012    Limmie Patricia, OTR/L,CBIS  [phone]  09/24/2014, 12:43 PM  [facility] [address] [place], [place], [address] Phone: [phone]   Fax:  [phone]

## 2014-09-29 ENCOUNTER — Ambulatory Visit (HOSPITAL_COMMUNITY): Payer: Federal, State, Local not specified - PPO | Admitting: Physical Therapy

## 2014-09-29 ENCOUNTER — Ambulatory Visit (HOSPITAL_COMMUNITY): Payer: Federal, State, Local not specified - PPO | Admitting: Speech Pathology

## 2014-09-29 ENCOUNTER — Encounter (HOSPITAL_COMMUNITY): Payer: Self-pay

## 2014-09-29 ENCOUNTER — Ambulatory Visit (HOSPITAL_COMMUNITY): Payer: Federal, State, Local not specified - PPO

## 2014-09-29 DIAGNOSIS — I69119 Unspecified symptoms and signs involving cognitive functions following nontraumatic intracerebral hemorrhage: Secondary | ICD-10-CM

## 2014-09-29 DIAGNOSIS — R269 Unspecified abnormalities of gait and mobility: Secondary | ICD-10-CM

## 2014-09-29 DIAGNOSIS — I69898 Other sequelae of other cerebrovascular disease: Secondary | ICD-10-CM | POA: Diagnosis not present

## 2014-09-29 DIAGNOSIS — R29898 Other symptoms and signs involving the musculoskeletal system: Secondary | ICD-10-CM

## 2014-09-29 DIAGNOSIS — IMO0002 Reserved for concepts with insufficient information to code with codable children: Secondary | ICD-10-CM

## 2014-09-29 DIAGNOSIS — R2689 Other abnormalities of gait and mobility: Secondary | ICD-10-CM

## 2014-09-29 DIAGNOSIS — R262 Difficulty in walking, not elsewhere classified: Secondary | ICD-10-CM

## 2014-09-29 DIAGNOSIS — G8110 Spastic hemiplegia affecting unspecified side: Secondary | ICD-10-CM

## 2014-09-29 NOTE — Therapy (Signed)
[facility] [address] [place], [place], [address] Phone: [phone]   Fax:  [phone]  Speech Language Pathology Treatment  Patient Details  Name: Jennifer Pope MRN: [id number] Date of Birth: 1970-07-07 Referring Provider:  Derrell Lolling, MD  Encounter Date: 09/29/2014      End of Session - 09/29/14 1205    Visit Number 4   Number of Visits 16   Date for SLP Re-Evaluation 10/11/14   Authorization Type BCBS federal   Authorization Time Period 08/11/2014-10/11/2014   Authorization - Visit Number 4   Authorization - Number of Visits 16   SLP Start Time 0945   SLP Stop Time  1019   SLP Time Calculation (min) 34 min   Activity Tolerance Patient tolerated treatment well      Past Medical History  Diagnosis Date  . Seizures   . Arteriovenous malformation   . Stroke     Past Surgical History  Procedure Laterality Date  . Ankle fracture surgery Right     2002    There were no vitals filed for this visit.  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 "   Patient is accompained by: Family member   Currently in Pain? No/denies               ADULT SLP TREATMENT - 09/29/14 1204    General Information   Behavior/Cognition Alert;Cooperative;Pleasant mood   Patient Positioning Upright in chair   Oral care provided N/A   HPI Mrs. Jennifer Pope is a 44 year old woman with a known h/o AVM previously treated with SRS in 2010 who presented to ER on 05/23/2014 with acute onset left-sided weakness. 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 She was emergently taken to OR for hemicraniotomy for evacuation of hematoma, subsequently she underwent trach and PEG placement which have since been removed. 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 The bone flap is currently being stored until swelling has subsided and pt expects this will be replaced in 6-8 months. She was on a modified diet for some time, but was discharged from [facility] on a regular diet with thin liquids (no longer needs to utilize chin tuck). Pt made tremendous progress while at [facility] and was discharged with mild cognitive deficits (previously mod/severe) and dysphonia. She is referred for outpatient SLP therapy to address residual deficits and increase independence/maximize functional return. She has good family support from her husband.    Treatment Provided   Treatment provided Cognitive-Linquistic   Pain Assessment   Pain Assessment No/denies pain   Cognitive-Linquistic Treatment   Treatment focused on Cognition;Patient/family/caregiver education   Skilled Treatment problem solving, left neglect strategies, appreciation of deficits/judgement   Assessment / Recommendations / Plan   Plan Continue with current plan of care            SLP Short Term Goals - 09/29/14 1230    SLP SHORT TERM GOAL #1   Title Pt will increase maximum exhalation time to 15 seconds with use of breath support exercises and mild/mod cues   Baseline 6 seconds   Time 4   Period Weeks   Status On-going   SLP SHORT TERM GOAL #2   Title Pt will complete moderate level working memory  tasks with 95% acc with use of compensatory strategies with mild/mod cues.   Baseline 75%   Time 4   Period Weeks   Status On-going   SLP SHORT TERM GOAL #3   Title Pt will be oriented to time and complex situations with 100% acc with use of calendar/planner and min cues.   Baseline 75%   Time 4   Period Weeks   Status On-going   SLP SHORT TERM GOAL #4   Title Pt will increase vocal intensity to [facility] for small group setting with use of compensatory strategies and mild/mod cues from SLP.   Baseline mild/mod impairment   Time 4   Period Weeks    Status On-going          SLP Long Term Goals - 09/29/14 1230    SLP LONG TERM GOAL #1   Title Pt will demonstrate improved phonation to [facility] for small group setting with use of strategies.   Baseline mild/mod impairment   Time 8   Period Weeks   Status On-going   SLP LONG TERM GOAL #2   Title Pt will increase attention and memory skills to [facility] for independent environment with use of compensatory strategies as needed.    Baseline supervision   Time 8   Period Weeks   Status On-going          Plan - 09/29/14 1208    Clinical Impression Statement Jennifer Pope was accompanied by her mother today for treatment. Her husband has gone back to work and her in-laws are no longer staying with her. She reports that she saw her neurologist last week and was told that it would be okay for her to drive down her driveway to the barn. Her mother looked alarmed when she heard this during session. She plans to check with Jennifer Pope, her husband, to ensure this is okay. Jennifer Pope stated that she had not been working on her speech homework (she was given deduction puzzles) because she has been "focusing more on her physical limitations". SLP explained that these cognitive tasks are a means to target attention, memory, and problem solving skills. Jennifer Pope reports that things are going well at home and she is teaching 2 students currently, but that she really wants to drive. She was given Trails A and Trails B which can provide information about visual seach speed, scanning, speed of processing, mental flexibility, and executive functioning. She completed Trails A in 105 seconds (average for age is about 30) and Trails B in 107 seconds (average for age is about 75 seconds). Left neglect negatively impacted her score, as she spent 30 seconds looking for the "12" on Trails A which was located on lower left side of the paper. Pt was given printed information on [employer] [facility] if she would further wish to  pursue driving. She has a consultation in early August with the doctor who will replace her skull plate. Continue POC and add focus on left neglect component.   Speech Therapy Frequency 2x / week   Duration --  8 weeks   Treatment/Interventions Cueing hierarchy;Compensatory techniques;Cognitive reorganization;Internal/external aids;Oral motor exercises;SLP instruction and feedback;Patient/family education;Compensatory strategies   Potential to Achieve Goals Good   SLP Home Exercise Plan Pt will be independent with HEP to facilitate carryover of treatment strategies in home/community environment.   Consulted and Agree with Plan of Care Patient;Family member/caregiver   Family Member Consulted husband, Derrick        Problem List Patient Active  Problem List   Diagnosis Date Noted  . Seizures 12/02/2012  . AVM (arteriovenous malformation) 12/02/2012   Thank you,  Havery MorosDabney Porter, CCC-SLP [phone]  [facility]ORTER,DABNEY 09/29/2014, 12:31 PM  [facility] [address] [place], [place], [address] Phone: [phone]   Fax:  [phone]

## 2014-09-29 NOTE — Patient Instructions (Signed)
Perform each exercise ___10_____ reps. 2-3x days.   Protraction   Start by holding a wand or cane at chest height.  Next, slowly push the wand outwards in front of your body so that your elbows become fully straightened. Then, return to the original position.     Shoulder FLEXION - Palms down  In the standing position, hold a wand/cane with both arms, palms down on both sides. Raise up the wand/cane allowing your unaffected arm to perform most of the effort. Your affected arm should be partially relaxed.      Internal/External ROTATION   In the standing position, hold a wand/cane with both hands keeping your elbows bent. Move your arms and wand/cane to one side.  Your affected arm should be partially relaxed while your unaffected arm performs most of the effort.       Shoulder ABDUCTION   While holding a wand/cane palm face up on the injured side and palm face down on the uninjured side, slowly raise up your injured arm to the side.       Horizontal Abduction/Adduction      Straight arms holding cane at shoulder height, bring cane to right, center, left. Repeat starting to left.   Copyright  VHI. All rights reserved.

## 2014-09-29 NOTE — Therapy (Addendum)
[facility] [address] [place], [place], [address] Phone: [phone]   Fax:  [phone]  Occupational Therapy Treatment  Patient Details  Name: Jennifer Pope MRN: [id number] Date of Birth: May 22, 1970 Referring Provider:  Derrell Lolling, MD  Encounter Date: 09/29/2014      OT End of Session - 09/29/14 1257    Visit Number 19   Number of Visits 36   Date for OT Re-Evaluation 10/10/14   Authorization Type BCBS    Authorization Time Period 50 visit limit for PT/OT/SLP (change visit number to reflect all disciplines with each tx session)   Authorization - Visit Number 37   Authorization - Number of Visits 50   OT Start Time 1105   OT Stop Time 1145   OT Time Calculation (min) 40 min   Activity Tolerance Patient tolerated treatment well   Behavior During Therapy [facility] for tasks assessed/performed      Past Medical History  Diagnosis Date  . Seizures   . Arteriovenous malformation   . Stroke     Past Surgical History  Procedure Laterality Date  . Ankle fracture surgery Right     2002    There were no vitals filed for this visit.  Visit Diagnosis:  Weakness due to cerebrovascular accident  Spastic hemiplegia affecting nondominant side      Subjective Assessment - 09/29/14 1117    Subjective  S: I didn't wear my sling very much yesterday so I could try to use my arm but today I have to wear the sling because it's sore.    Currently in Pain? 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 Helmet on at all times when ambulating.                   OT Treatments/Exercises (OP) - 09/29/14 1247     Exercises   Exercises Neurological Re-education;Shoulder;Elbow   Shoulder Exercises: Supine   Protraction AAROM;10 reps   Horizontal ABduction AAROM;10 reps   External Rotation AAROM;10 reps   External Rotation Limitations hand over hand   Internal Rotation AAROM;10 reps   Flexion AAROM;10 reps   ABduction AAROM;10 reps;Limitations   ABduction Limitations Hand over hand   Shoulder Exercises: Isometric Strengthening   Flexion 3X5"   Extension 3X5"   External Rotation 3X5"   Internal Rotation 3X5"   ABduction 3X5"   ADduction 3X5"   Elbow Exercises   Other elbow exercises Elbow flexion and extension isometric exercise; 3x5"   Functional Reaching Activities   Low Level While seated: Patient utilized ES on left wrist extensors. As ES facilitated wrist extension, patient reached for bean bags located on right side of table. Once wrist extension relaxed, patient actively grabbed bean bag and slide it to the left side of the table. Pt complete this task approx. 12-15 times. Pt then grabbed bean bags on left side of table and slide to the right side, attempting to slide bean bags off  the table. Pt required active assistance to stabilize shoulder and elbow in order to decrease unnecessary movement during task.  Modalities   Modalities Insurance account manager Location left wrist extensors   Engineer, manufacturing Academic librarian Parameters 33 mA CC   Electrical Stimulation Goals Neuromuscular facilitation                OT Education - 09/29/14 1108    Education provided Yes   Education Details AAROM exercises   Person(s) Educated Patient   Methods Explanation;Demonstration;Handout   Comprehension Verbalized understanding;Returned demonstration          OT Short Term Goals - 09/10/14 1509    OT SHORT TERM GOAL #1   Title Patient will be educated on a HEP.   Time 6   Period Weeks   Status On-going    OT SHORT TERM GOAL #2   Title Patient will use her left arm as a gross assist with daily activites.   Time 6   Period Weeks   Status On-going   OT SHORT TERM GOAL #3   Title Patient will decrease subluxation in left shoulder to 1.5 digits for decreased pain with shoulder use.   Time 6   Period Weeks   Status On-going   OT SHORT TERM GOAL #4   Title Patient will weightbear on her left arm with max pa while competing functional activiites.    Time 6   Period Weeks   Status On-going   OT SHORT TERM GOAL #5   Title Patient will have trace mobility in her LUE for increased ability to complete BADLs.   Time 6   Period Weeks   OT SHORT TERM GOAL #6   Title Patient will complete bathing and dressing with close min guard.    Period Weeks   OT SHORT TERM GOAL #7   Title Patient will attend to left side during ADLs and functional ambulation 50% of time.    Period Weeks           OT Long Term Goals - 09/10/14 1509    OT LONG TERM GOAL #1   Title Patient will use her left arm as an active assist with daily tasks.   Time 12   Period Weeks   Status On-going   OT LONG TERM GOAL #2   Title Patient will have 50% volitional movement in her left arm.   Time 12   Period Weeks   Status On-going   OT LONG TERM GOAL #3   Title Patient will be able to weightbear with min pa or less.   Time 12   Period Weeks   Status On-going   OT LONG TERM GOAL #4   Title Patient will decrease subluxation to 1 digit or less in right shoulder region.    Time 12   Period Weeks   Status On-going   OT LONG TERM GOAL #5   Title Patient will attend to left independently.   Time 12   Period Weeks   Status On-going   OT LONG TERM GOAL #6   Title Patient will improve to Brunnstrom stage IV in LUE.   Time 12   Period Weeks   Status On-going   OT LONG TERM GOAL #7   Title Patient wll complete all ADLs and ambulation with Mod I.   Period Weeks               Plan - 09/29/14 1258    Clinical  Impression Statement A: Pt had better control over shoulder movements this  date when completing AAROM supine. Added shoulder and elbow isometrics. patient tolerated well.    Plan P: Cont with ES on wrist extensors during low level functional reaching activitiy. Focus on         Problem List Patient Active Problem List   Diagnosis Date Noted  . Seizures 12/02/2012  . AVM (arteriovenous malformation) 12/02/2012   Limmie PatriciaLaura Abel Ra, OTR/L,CBIS  [phone]  09/29/2014, 2:25 PM  [place] [facility] [address] , [place], [address] Phone: [phone]   Fax:  [phone]

## 2014-09-29 NOTE — Therapy (Signed)
[facility] [address] [place], [place], [address] Phone: [phone]   Fax:  [phone]  Physical Therapy Treatment  Patient Details  Name: Jennifer Pope MRN: [id number] Date of Birth: 12-24-1970 Referring Provider:  Derrell Lolling, MD  Encounter Date: 09/29/2014      PT End of Session - 09/29/14 1302    Visit Number 14   Number of Visits 16   Date for PT Re-Evaluation 10/11/14   Authorization Type BCBS   Authorization - Visit Number 14   Authorization - Number of Visits 16   PT Start Time 1018   PT Stop Time 1059   PT Time Calculation (min) 41 min   Equipment Utilized During Treatment Gait belt   Activity Tolerance Patient tolerated treatment well   Behavior During Therapy WFL for tasks assessed/performed      Past Medical History  Diagnosis Date  . Seizures   . Arteriovenous malformation   . Stroke     Past Surgical History  Procedure Laterality Date  . Ankle fracture surgery Right     2002    There were no vitals filed for this visit.  Visit Diagnosis:  Weakness due to cerebrovascular accident  Difficulty walking  Abnormality of gait  Left leg weakness  Decreased functional mobility      Subjective Assessment - 09/29/14 1021    Subjective Pt reports that she has been focusing on taking smaller steps and trying not to drag her foot. She went to the pool this weekend and was able to walk without losing her balance in the water.    Currently in Pain? No/denies              [facility] Adult PT Treatment/Exercise - 09/29/14 0001    Ambulation/Gait   Ambulation/Gait Yes   Ambulation/Gait Assistance 4: Min guard   Ambulation Distance (Feet) 452 Feet   Assistive device Large base quad cane   Gait Pattern Decreased hip/knee flexion - left;Decreased dorsiflexion - left;Left circumduction;Wide base of support;Poor foot clearance - left   Knee/Hip Exercises: Standing   Other Standing Knee Exercises  side step at mat x 2 RT; weight shifting 10 Rt to Lt   Other Standing Knee Exercises Cone rotation from table to chair x 2   Knee/Hip Exercises: Seated   Heel Slides 2 sets;5 reps   Heel Slides Limitations tactile facilitation   Other Seated Knee/Hip Exercises Standing: tap ups at 6 inch step to improve hip/knee flexion   Knee/Hip Exercises: Prone   Other Prone Exercises tall kneeling walking forward and back; sidestepping in tall kneeling x 3; PNF pattern with Lt UE from mat to ceiling                 PT Education - 09/29/14 1302    Education provided Yes   Education Details Step length, seated heel slides   Person(s) Educated Patient   Methods Explanation   Comprehension Verbalized understanding          PT Short Term Goals - 09/22/14 1601    PT SHORT TERM GOAL #1   Title Patient will demosntrate increased ankel dorsiflexion strength of 2/5 MMT to decrease risk of trippign over Lt foot due to Lt foot drop.   Status On-going   PT SHORT TERM GOAL #2   Title Patient will demonstrate increased Lt hamstring/calf strength of 4-/5 MMT to decrease risk of Lt knee hyper extension.    Status On-going   PT SHORT  TERM GOAL #3   Title Patient will dmeosntrate increased Lt hip flexion of 4/5 MMT to be able to ambulate with improved stride length.    Status On-going   PT SHORT TERM GOAL #4   Title patient will be abel to ambualte with a SPC for 100 ft   PT SHORT TERM GOAL #5   Title I with HEP for progression of strength   Status On-going           PT Long Term Goals - 09/22/14 1601    PT LONG TERM GOAL #1   Title Patient will demonstrate increased ankle dorsiflexion strength of 3/5 MMT to decrease risk of tripping over Lt foot due to Lt foot drop   PT LONG TERM GOAL #2   Title Patient will demonstrate increased Lt hamstring/calf strength of 5/5 MMT to decrease risk of Lt knee hyper extension.    PT LONG TERM GOAL #3   Title Patient will demosntrate increased Lt hip  extension of 4/5 MMT to be able to ambulate with improved stride length and perofmr sit to stand without dependence of Rt LE for perofrmance of sit to stand.    PT LONG TERM GOAL #4   Title Patient will be able to ambulate up and down stairs with only 1 HHA.    PT LONG TERM GOAL #5   Title Patient will demonstrate increased abdominal strength of 5/5 MMT to be able ride horses.                Plan - 09/29/14 1303    Clinical Impression Statement Treatment today included gait training with large based quad cane, which pt was able to use without increased difficulty or LOB. She will benefit from continued gait training with [facility] to improve independence with functional mobility. Pt had more difficulty with tall kneeling activities in today's treatment than she did last treatment.    PT Next Visit Plan Continue with gait training with [facility], hip/knee flexion activities        Problem List Patient Active Problem List   Diagnosis Date Noted  . Seizures 12/02/2012  . AVM (arteriovenous malformation) 12/02/2012    Leona SingletonLauren Holmes Hays, PT, DPT [phone] 09/29/2014, 1:07 PM  [place] [facility] [address] [place], [place], [address] Phone: [phone]   Fax:  [phone]

## 2014-10-01 ENCOUNTER — Ambulatory Visit (HOSPITAL_COMMUNITY): Payer: Federal, State, Local not specified - PPO

## 2014-10-01 ENCOUNTER — Ambulatory Visit (HOSPITAL_COMMUNITY): Payer: Federal, State, Local not specified - PPO | Admitting: Physical Therapy

## 2014-10-01 ENCOUNTER — Encounter (HOSPITAL_COMMUNITY): Payer: Self-pay

## 2014-10-01 DIAGNOSIS — I69898 Other sequelae of other cerebrovascular disease: Secondary | ICD-10-CM | POA: Diagnosis not present

## 2014-10-01 DIAGNOSIS — R2689 Other abnormalities of gait and mobility: Secondary | ICD-10-CM

## 2014-10-01 DIAGNOSIS — IMO0002 Reserved for concepts with insufficient information to code with codable children: Secondary | ICD-10-CM

## 2014-10-01 DIAGNOSIS — R269 Unspecified abnormalities of gait and mobility: Secondary | ICD-10-CM

## 2014-10-01 DIAGNOSIS — R262 Difficulty in walking, not elsewhere classified: Secondary | ICD-10-CM

## 2014-10-01 DIAGNOSIS — R29898 Other symptoms and signs involving the musculoskeletal system: Secondary | ICD-10-CM

## 2014-10-01 NOTE — Therapy (Signed)
[facility] [address] [place], [place], [address] Phone: [phone]   Fax:  [phone]  Physical Therapy Treatment  Patient Details  Name: Jennifer Pope MRN: [id number] Date of Birth: Jul 10, 1970 Referring Provider:  Derrell Lolling, MD  Encounter Date: 10/01/2014      PT End of Session - 10/01/14 1159    Visit Number 15   Number of Visits 16   Date for PT Re-Evaluation 10/11/14   Authorization Type BCBS   Authorization - Visit Number 15   Authorization - Number of Visits 16   PT Start Time 1029   PT Stop Time 1102   PT Time Calculation (min) 33 min   Equipment Utilized During Treatment Gait belt   Activity Tolerance Patient tolerated treatment well   Behavior During Therapy WFL for tasks assessed/performed      Past Medical History  Diagnosis Date  . Seizures   . Arteriovenous malformation   . Stroke     Past Surgical History  Procedure Laterality Date  . Ankle fracture surgery Right     2002    There were no vitals filed for this visit.  Visit Diagnosis:  Weakness due to cerebrovascular accident  Difficulty walking  Abnormality of gait  Left leg weakness  Decreased functional mobility      Subjective Assessment - 10/01/14 1048    Subjective Pt denies having any pain today. She feels more fatigued today because she did a lot of weight-bearing exercises yesterday, including tall kneeling walking and quadruped weight shifting.    Currently in Pain? No/denies                [facility] Adult PT Treatment/Exercise - 10/01/14 0001    Ambulation/Gait   Ambulation/Gait Yes   Ambulation/Gait Assistance 4: Min guard;5: Supervision   Ambulation Distance (Feet) 452 Feet   Assistive device Large base quad cane   Gait Pattern Decreased hip/knee flexion - left;Decreased dorsiflexion - left;Left circumduction;Wide base of support;Poor foot clearance - left   Knee/Hip Exercises: Standing   Other Standing  Knee Exercises Cone rotation from 18 inch step to table   Knee/Hip Exercises: Seated   Heel Slides 2 sets;5 reps   Heel Slides Limitations tactile facilitation   Other Seated Knee/Hip Exercises Standing: tap ups at 4 inch step to improve hip/knee flexion   Knee/Hip Exercises: Sidelying   Clams 10 reps with red tband   Knee/Hip Exercises: Prone   Other Prone Exercises tall kneeling walking forward and back; sidestepping in tall kneeling x 3; PNF pattern with Lt UE from mat to ceiling                 PT Education - 10/01/14 1158    Education provided Yes   Education Details Added clams to [employer]) Educated Patient;Other (comment)  sister   Methods Explanation;Demonstration   Comprehension Verbalized understanding;Returned demonstration          PT Short Term Goals - 09/22/14 1601    PT SHORT TERM GOAL #1   Title Patient will demosntrate increased ankel dorsiflexion strength of 2/5 MMT to decrease risk of trippign over Lt foot due to Lt foot drop.   Status On-going   PT SHORT TERM GOAL #2   Title Patient will demonstrate increased Lt hamstring/calf strength of 4-/5 MMT to decrease risk of Lt knee hyper extension.    Status On-going   PT SHORT TERM GOAL #3   Title Patient will dmeosntrate  increased Lt hip flexion of 4/5 MMT to be able to ambulate with improved stride length.    Status On-going   PT SHORT TERM GOAL #4   Title patient will be abel to ambualte with a SPC for 100 ft   PT SHORT TERM GOAL #5   Title I with HEP for progression of strength   Status On-going           PT Long Term Goals - 09/22/14 1601    PT LONG TERM GOAL #1   Title Patient will demonstrate increased ankle dorsiflexion strength of 3/5 MMT to decrease risk of tripping over Lt foot due to Lt foot drop   PT LONG TERM GOAL #2   Title Patient will demonstrate increased Lt hamstring/calf strength of 5/5 MMT to decrease risk of Lt knee hyper extension.    PT LONG TERM GOAL #3   Title  Patient will demosntrate increased Lt hip extension of 4/5 MMT to be able to ambulate with improved stride length and perofmr sit to stand without dependence of Rt LE for perofrmance of sit to stand.    PT LONG TERM GOAL #4   Title Patient will be able to ambulate up and down stairs with only 1 HHA.    PT LONG TERM GOAL #5   Title Patient will demonstrate increased abdominal strength of 5/5 MMT to be able ride horses.                Plan - 10/01/14 1221    Clinical Impression Statement Pt 10 minutes late for treatment today. Treatment focused on gait training with [facility] and balance activities to decrease fall risk. Pt feels that her biggest limitations are her walking and her decreased balance. Pt [facility] given [facility] to [facility] with at [facility].    PT Next Visit Plan Reassess next visit, progress gait training.         Problem List Patient Active Problem List   Diagnosis Date Noted  . Seizures 12/02/2012  . AVM (arteriovenous malformation) 12/02/2012    Leona SingletonLauren Sahily Biddle, PT, DPT [phone] 10/01/2014, 12:27 PM  [place] [facility] [address] [place], [place], [address] Phone: [phone]   Fax:  [phone]

## 2014-10-01 NOTE — Therapy (Signed)
[facility] [facility] [address] , [place], [address] Phone: [phone]   Fax:  [phone]  Occupational Therapy Treatment  Patient Details  Name: Jennifer Pope MRN: [id number] Date of Birth: 01/13/1971 Referring Provider:  Derrell Lollingrenstein, Raphael, MD  Encounter Date: 10/01/2014      OT End of Session - 10/01/14 1245    Visit Number 20   Number of Visits 36   Date for OT Re-Evaluation 10/10/14   Authorization Type BCBS    Authorization Time Period 50 visit limit for PT/OT/SLP (change visit number to reflect all disciplines with each tx session)   Authorization - Visit Number 39   Authorization - Number of Visits 50   OT Start Time 1105   OT Stop Time 1150   OT Time Calculation (min) 45 min   Activity Tolerance Patient tolerated treatment well   Behavior During Therapy [facility] for tasks assessed/performed      Past Medical History  Diagnosis Date  . Seizures   . Arteriovenous malformation   . Stroke     Past Surgical History  Procedure Laterality Date  . Ankle fracture surgery Right     2002    There were no vitals filed for this visit.  Visit Diagnosis:  Weakness due to cerebrovascular accident      Subjective Assessment - 10/01/14 1233    Subjective  S: I get to use the quad cane now at home.    Patient is accompained by: Family member   Currently in Pain? 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 Helmet on at all times when ambulating.                   OT Treatments/Exercises (OP) - 10/01/14 1236    Visual/Perceptual Exercises   Scanning Other   Scanning - Other Pt completed various worksheets with focus on visual scanning and memory. Increased time needed to complete tasks of moderate difficulty. More difficulty  noticed with keeping track of place when scanning left to right or top down. Pt was able to correct errors when pointed out.    Functional Reaching Activities   Low Level While seated: Patient utilized ES on left wrist extensors. As ES facilitated wrist extension, patient reached for bean bags located on left side of table. Once wrist extension relaxed, patient actively grabbed bean bag and slide it to the right side of the table. Pt complete this task approx. 12-15 times. Pt then grabbed bean bags on right side of table and slide to the left side, attempting to slide bean bags off the table. Pt required active assistance to stabilize shoulder and elbow in order to decrease unnecessary movement during task. Mod-max assist to provide active assistance for shoulder and elbow movements. Tapping to faciliate muscle activation.    Modalities   Modalities Insurance account managerlectrical Stimulation   Electrical Stimulation   Electrical Stimulation Location left wrist extensors   Statisticianlectrical Stimulation Action Academic librarianussian   Electrical Stimulation Parameters 31 mA CC   Electrical Stimulation Goals Neuromuscular facilitation                  OT Short Term Goals - 09/10/14 1509    OT SHORT TERM GOAL #1   Title Patient will  be educated on a HEP.   Time 6   Period Weeks   Status On-going   OT SHORT TERM GOAL #2   Title Patient will use her left arm as a gross assist with daily activites.   Time 6   Period Weeks   Status On-going   OT SHORT TERM GOAL #3   Title Patient will decrease subluxation in left shoulder to 1.5 digits for decreased pain with shoulder use.   Time 6   Period Weeks   Status On-going   OT SHORT TERM GOAL #4   Title Patient will weightbear on her left arm with max pa while competing functional activiites.    Time 6   Period Weeks   Status On-going   OT SHORT TERM GOAL #5   Title Patient will have trace mobility in her LUE for increased ability to complete BADLs.   Time 6   Period Weeks    OT SHORT TERM GOAL #6   Title Patient will complete bathing and dressing with close min guard.    Period Weeks   OT SHORT TERM GOAL #7   Title Patient will attend to left side during ADLs and functional ambulation 50% of time.    Period Weeks           OT Long Term Goals - 09/10/14 1509    OT LONG TERM GOAL #1   Title Patient will use her left arm as an active assist with daily tasks.   Time 12   Period Weeks   Status On-going   OT LONG TERM GOAL #2   Title Patient will have 50% volitional movement in her left arm.   Time 12   Period Weeks   Status On-going   OT LONG TERM GOAL #3   Title Patient will be able to weightbear with min pa or less.   Time 12   Period Weeks   Status On-going   OT LONG TERM GOAL #4   Title Patient will decrease subluxation to 1 digit or less in right shoulder region.    Time 12   Period Weeks   Status On-going   OT LONG TERM GOAL #5   Title Patient will attend to left independently.   Time 12   Period Weeks   Status On-going   OT LONG TERM GOAL #6   Title Patient will improve to Brunnstrom stage IV in LUE.   Time 12   Period Weeks   Status On-going   OT LONG TERM GOAL #7   Title Patient wll complete all ADLs and ambulation with Mod I.   Period Weeks               Plan - 10/01/14 1246    Clinical Impression Statement A: Pt had some fair bicep and shoulder elevation when completing low level functional reach activity.    Plan P: Cont with ES on wrist extensors during functional reaching tasks. Inform patient of visit limit for insurance.         Problem List Patient Active Problem List   Diagnosis Date Noted  . Seizures 12/02/2012  . AVM (arteriovenous malformation) 12/02/2012    Limmie Patricia, OTR/L,CBIS  [phone]  10/01/2014, 12:54 PM  [place] [facility] [address] [place], [place], [address] Phone: [phone]   Fax:  [phone]

## 2014-10-04 ENCOUNTER — Ambulatory Visit (HOSPITAL_COMMUNITY): Payer: Federal, State, Local not specified - PPO | Admitting: Physical Therapy

## 2014-10-04 ENCOUNTER — Encounter (HOSPITAL_COMMUNITY): Payer: Self-pay

## 2014-10-04 ENCOUNTER — Ambulatory Visit (HOSPITAL_COMMUNITY): Payer: Federal, State, Local not specified - PPO

## 2014-10-04 ENCOUNTER — Ambulatory Visit (HOSPITAL_COMMUNITY): Payer: Federal, State, Local not specified - PPO | Admitting: Speech Pathology

## 2014-10-04 DIAGNOSIS — I69119 Unspecified symptoms and signs involving cognitive functions following nontraumatic intracerebral hemorrhage: Secondary | ICD-10-CM

## 2014-10-04 DIAGNOSIS — R490 Dysphonia: Secondary | ICD-10-CM

## 2014-10-04 DIAGNOSIS — R262 Difficulty in walking, not elsewhere classified: Secondary | ICD-10-CM

## 2014-10-04 DIAGNOSIS — I69898 Other sequelae of other cerebrovascular disease: Secondary | ICD-10-CM | POA: Diagnosis not present

## 2014-10-04 DIAGNOSIS — R269 Unspecified abnormalities of gait and mobility: Secondary | ICD-10-CM

## 2014-10-04 DIAGNOSIS — R29898 Other symptoms and signs involving the musculoskeletal system: Secondary | ICD-10-CM

## 2014-10-04 DIAGNOSIS — R2689 Other abnormalities of gait and mobility: Secondary | ICD-10-CM

## 2014-10-04 DIAGNOSIS — IMO0002 Reserved for concepts with insufficient information to code with codable children: Secondary | ICD-10-CM

## 2014-10-04 NOTE — Therapy (Signed)
[place] [facility] [address] [place], [place], [address] Phone: [phone]   Fax:  [phone]  Physical Therapy Treatment (Re-Assessment)  Patient Details  Name: Jennifer Pope MRN: [id number] Date of Birth: 01-29-1971 Referring Provider:  Derrell Lolling, MD  Encounter Date: 10/04/2014      PT End of Session - 10/04/14 1204    Visit Number 16   Number of Visits 20   Date for PT Re-Evaluation 11/01/14   Authorization Type BCBS   Authorization - Visit Number 16   Authorization - Number of Visits 20   PT Start Time 1018   PT Stop Time 1100   PT Time Calculation (min) 42 min   Equipment Utilized During Treatment Gait belt   Activity Tolerance Patient tolerated treatment well   Behavior During Therapy WFL for tasks assessed/performed      Past Medical History  Diagnosis Date  . Seizures   . Arteriovenous malformation   . Stroke     Past Surgical History  Procedure Laterality Date  . Ankle fracture surgery Right     2002    There were no vitals filed for this visit.  Visit Diagnosis:  Weakness due to cerebrovascular accident - Plan: PT plan of care cert/re-cert  Difficulty walking - Plan: PT plan of care cert/re-cert  Abnormality of gait - Plan: PT plan of care cert/re-cert  Left leg weakness - Plan: PT plan of care cert/re-cert  Decreased functional mobility - Plan: PT plan of care cert/re-cert      Subjective Assessment - 10/04/14 1023    Subjective Patient not having any pain today, states that she has been trying to walk bending knee more instead of picking up hip; also reports that she should be getting bone flap back in august.    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 Lt AFO for prevention of knee yper extension an dprevention of Lt foot drop. pain its predominantly in shoulder secondary to Lt shoulder subluxation for which patient will be seeing OT. patient arriveswwearing helmet  for protection of brain flap.    Currently in Pain? No/denies            [facility] PT Assessment - 10/04/14 0001    Assessment   Medical Diagnosis Stroke   Onset Date/Surgical Date 05/23/14   Precautions   Precautions Fall;Other (comment)   Precaution Comments left side neglect. Helmet on at all times when ambulating.    Strength   Right Hip Flexion 4/5   Right Hip ABduction 4/5   Left Hip Flexion 3-/5   Left Hip ABduction 2+/5   Right Knee Flexion 5/5   Right Knee Extension 5/5   Left Knee Flexion 2-/5   Left Knee Extension 2+/5   Right Ankle Dorsiflexion 5/5   Left Ankle Dorsiflexion 2-/5   Bed Mobility   Rolling Right 6: Modified independent (Device/Increase time)   Rolling Left 6: Modified independent (Device/Increase time)   Supine to Sit 6: Modified independent (Device/Increase time)   Sit to Supine 6: Modified independent (Device/Increase time)   Ambulation/Gait   Ambulation/Gait Yes   Ambulation/Gait Assistance 4: Min guard   Assistive device Straight cane;None  SPC and no device    Gait Pattern Decreased hip/knee flexion - left;Decreased dorsiflexion - left;Left circumduction;Wide base of support;Poor foot clearance - left   Gait Comments Stair training with Min guard, U railing; noted circumduction of  L LE as well as L LE instability during stair  navigation                      [facility] Adult PT Treatment/Exercise - 10/04/14 0001    Knee/Hip Exercises: Standing   Rocker Board Limitations Lateral only x20 with Min guard and U HHA    Knee/Hip Exercises: Seated   Other Seated Knee/Hip Exercises Seated balance activities on blue air pad; pelvic walking along mat table x3                PT Education - 10/04/14 1203    Education provided Yes   Education Details education for safety with SPC and walking with no device; education on water exercises    Person(s) Educated Patient;Spouse   Methods Explanation   Comprehension Verbalized understanding           PT Short Term Goals - 10/04/14 1032    PT SHORT TERM GOAL #1   Title Patient will demosntrate increased ankel dorsiflexion strength of 2/5 MMT to decrease risk of trippign over Lt foot due to Lt foot drop.   Time 4   Period Weeks   Status On-going   PT SHORT TERM GOAL #2   Title Patient will demonstrate increased Lt hamstring/calf strength of 4-/5 MMT to decrease risk of Lt knee hyper extension.    Time 4   Period Weeks   Status On-going   PT SHORT TERM GOAL #3   Title Patient will dmeosntrate increased Lt hip flexion of 4/5 MMT to be able to ambulate with improved stride length.    Time 4   Period Weeks   Status On-going   PT SHORT TERM GOAL #4   Title patient will be abel to ambualte with a SPC for 100 ft   Baseline 7/11- 23ft with SPC    Period Weeks   Status Achieved   PT SHORT TERM GOAL #5   Title I with HEP for progression of strength   Baseline 7/11- continuing to do it every day,    Time 4   Period Weeks   Status Achieved           PT Long Term Goals - 10/04/14 1038    PT LONG TERM GOAL #1   Title Patient will demonstrate increased ankle dorsiflexion strength of 3/5 MMT to decrease risk of tripping over Lt foot due to Lt foot drop   Time 8   Period Weeks   Status On-going   PT LONG TERM GOAL #2   Title Patient will demonstrate increased Lt hamstring/calf strength of 5/5 MMT to decrease risk of Lt knee hyper extension.    Time 8   Period Weeks   Status On-going   PT LONG TERM GOAL #3   Title Patient will demosntrate increased Lt hip extension of 4/5 MMT to be able to ambulate with improved stride length and perofmr sit to stand without dependence of Rt LE for perofrmance of sit to stand.    Time 8   Period Weeks   Status On-going   PT LONG TERM GOAL #4   Title Patient will be able to ambulate up and down stairs with only 1 HHA.    Baseline 7/11- patient able to navigate stairs with U railing    Time 8   Period Weeks   Status Achieved   PT  LONG TERM GOAL #5   Title Patient will demonstrate increased abdominal strength of 5/5 MMT to be able ride horses.    Time 8  Period Weeks   Status On-going               Plan - 10/04/14 1204    Clinical Impression Statement Re-assessment performed today. Patient continues to demonstrate residual weakness and physical impairments associated with neurological insult, but does appear to be improving functionally as she is virtually independent with bed mobiltiy and has been working on stairs at home, also has started walking without an assistive device at home when someone is with her. However patient does continue to show generlalized weakness especially on affected side, reduced balance, impaired gait and stair navigation skills, and  reduced safety awareness; she will benefit from another 4 sessions of skilled PT services to address these impairments.    Pt will benefit from skilled therapeutic intervention in order to improve on the following deficits Abnormal gait;Decreased strength;Difficulty walking;Decreased activity tolerance;Decreased balance;Impaired flexibility;Decreased endurance   Rehab Potential Good   PT Frequency Other (comment)  4 more sessions due to insurance limitations    PT Duration Other (comment)  4 more sessions due to insurance limitations    PT Treatment/Interventions Therapeutic exercise;Balance training;Neuromuscular re-education;Patient/family education;Gait training;Manual techniques;Functional mobility training;Therapeutic activities   PT Next Visit Plan Progress gait training with SPC and with no device; core strengthening; proprioceptive training    PT Home Exercise Plan modify as needed.   Consulted and Agree with Plan of Care Patient        Problem List Patient Active Problem List   Diagnosis Date Noted  . Seizures 12/02/2012  . AVM (arteriovenous malformation) 12/02/2012    Nedra HaiKristen Unger PT, DPT [phone]  [facility] [address] [place], [place], [address] Phone: [phone]   Fax:  [phone]

## 2014-10-04 NOTE — Therapy (Signed)
[place], [place], [address] Phone: [phone]   Fax:  [phone]  Speech Language Pathology Treatment  Patient Details  Name: Jennifer Pope MRN: [id number] Date of Birth: 08-17-1970 Referring Provider:  Carlos Levering, MD  Encounter Date: 10/04/2014      End of Session - 10/04/14 1424    Visit Number 5   Number of Visits 16   Date for SLP Re-Evaluation 10/11/14   Authorization Type BCBS federal   Authorization Time Period 08/11/2014-10/11/2014   Authorization - Visit Number 5   Authorization - Number of Visits 7   SLP Start Time 0940   SLP Stop Time  1020   SLP Time Calculation (min) 40 min   Activity Tolerance Patient tolerated treatment well      Past Medical History  Diagnosis Date  . Seizures   . Arteriovenous malformation   . Stroke     Past Surgical History  Procedure Laterality Date  . Ankle fracture surgery Right     2002    There were no vitals filed for this visit.  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 No/denies                 SLP Education - 10/04/14 1423    Education provided Yes   Education Details Information regarding left neglect and memory strategies/home exercise program post discharge   Person(s) Educated Patient;Spouse   Methods Explanation;Handout   Comprehension Verbalized understanding          SLP Short Term Goals - 10/04/14 1433    SLP SHORT TERM GOAL #1   Title Pt will increase maximum exhalation time to 15 seconds with use of breath support exercises and mild/mod cues   Baseline 6 seconds   Time 4   Period Weeks   Status Partially Met   SLP SHORT TERM GOAL #2   Title Pt will complete moderate level  working memory tasks with 95% acc with use of compensatory strategies with mild/mod cues.   Baseline 75%   Time 4   Period Weeks   Status Achieved   SLP SHORT TERM GOAL #3   Title Pt will be oriented to time and complex situations with 100% acc with use of calendar/planner and min cues.   Baseline 75%   Time 4   Period Weeks   Status Achieved   SLP SHORT TERM GOAL #4   Title Pt will increase vocal intensity to [facility] for small group setting with use of compensatory strategies and mild/mod cues from SLP.   Baseline mild/mod impairment   Time 4   Period Weeks   Status Achieved          SLP Long Term Goals - 10/04/14 1434    SLP LONG TERM GOAL #1   Title Pt will demonstrate improved phonation to [facility] for small group setting with use of strategies.   Baseline mild/mod impairment   Time 8   Period Weeks   Status Achieved   SLP LONG TERM GOAL #2   Title Pt will increase attention and memory skills to [facility] for independent environment with use of compensatory strategies as needed.    Baseline supervision   Time 8   Period Weeks  Status Partially Met          Plan - 10/04/14 1433    Clinical Impression Statement Mrs. Jennifer Pope was accompanied by her husband today. Insurance coverage information was reviewed (only has a total allowance of 40 visits between OT/PT/SLP) and pt's primary (most pressing) need is to address her left upper extremity. For that reason, we decided to make today our final SLP session and discharge to a home program. Pt and husband are in agreement with plan. Pt is using a calendar at home for memory and organization and it is working well per pt/spouse. She really wants to be able to drive and has looked into the course offered in [place]. Her biggest hurdle will be addressing/compensating for her left neglect. Pt and spouse acknowledge the same. Pt encouraged to read at home, complete mazes/word searches/connect the dots, and place alerts on the left side  (colored tape etc) to continue addressing this deficit. Pt has made good progress and she will be discharged with a home exercise program due to limited insurance coverage for visits. Pt/spouse are in agreement with plan.    Duration --  8 weeks   Treatment/Interventions Cueing hierarchy;Compensatory techniques;Cognitive reorganization;Internal/external aids;Oral motor exercises;SLP instruction and feedback;Patient/family education;Compensatory strategies   Potential to Achieve Goals Good   SLP Home Exercise Plan Pt will be independent with HEP to facilitate carryover of treatment strategies in home/community environment.   Consulted and Agree with Plan of Care Patient;Family member/caregiver   Family Member Consulted husband, Jennifer Pope        Problem List Patient Active Problem List   Diagnosis Date Noted  . Seizures 12/02/2012  . AVM (arteriovenous malformation) 12/02/2012   SPEECH THERAPY DISCHARGE SUMMARY  Visits from Start of Care: 5  Current functional level related to goals / functional outcomes: Vocal intensity is WNL for small, group setting. Pt continues to have some left neglect which she will continue addressing in home program   Remaining deficits: Left neglect; mild cognitive deficits (attention/memory)   Education / Equipment: Discharged to home program  Plan: Patient agrees to discharge.  Patient goals were partially met. Patient is being discharged due to financial reasons.  ?????        Thank you,  Genene Churn, [place]  [facility] 10/04/2014, 2:41 PM  [place], [place], [address] Phone: [phone]   Fax:  [phone]

## 2014-10-04 NOTE — Therapy (Signed)
[place] [facility] [address] [place], [place], [address] Phone: [phone]   Fax:  [phone]  Occupational Therapy Treatment  Patient Details  Name: Jennifer Pope MRN: [id number] Date of Birth: May 13, 1970 Referring Provider:  Derrell Lolling, MD  Encounter Date: 10/04/2014      OT End of Session - 10/04/14 1235    Visit Number 21   Number of Visits 36   Date for OT Re-Evaluation 10/10/14   Authorization Type BCBS    Authorization Time Period 50 visit limit for PT/OT/SLP (change visit number to reflect all disciplines with each tx session)   Authorization - Visit Number 42   Authorization - Number of Visits 50   OT Start Time 1103   OT Stop Time 1155   OT Time Calculation (min) 52 min   Activity Tolerance Patient tolerated treatment well   Behavior During Therapy [facility] for tasks assessed/performed      Past Medical History  Diagnosis Date  . Seizures   . Arteriovenous malformation   . Stroke     Past Surgical History  Procedure Laterality Date  . Ankle fracture surgery Right     2002    There were no vitals filed for this visit.  Visit Diagnosis:  Weakness due to cerebrovascular accident      Subjective Assessment - 10/04/14 1226    Subjective  S: I try to use my left arm at home but it usually ends up just falling of the table or getting pulled into me.    Currently in Pain? 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 Helmet on at all times when ambulating.                   OT Treatments/Exercises (OP) - 10/04/14 1227    Exercises   Exercises Neurological Re-education;Shoulder;Elbow   Elbow Exercises   Elbow Extension AAROM  with ES assistance   Other elbow exercises Elbow flexion AAROM with ES assist   Functional Reaching Activities   Low Level While seated: Patient utilized ES on left wrist extensors. As ES facilitated wrist extension, patient reached for bean bags located on left side of table. Once wrist extension relaxed, patient actively grabbed bean bag and slide it to the right side of the table. Pt complete this task approx. 12-15 times. Pt then grabbed bean bags on right side of table and slide to the left side, attempting to slide bean bags off the table. Pt required active assistance to stabilize shoulder and elbow in order to decrease unnecessary movement during task. Mod-max assist to provide active assistance for shoulder and elbow movements. Tapping to faciliate muscle activation. Pt then utilized ES on elbow flexors and extensors while actively trying to complete movements.   Modalities   Modalities Administrator, sports Stimulation Location 1) Left wrist extensors 2) Elbow flexors 3) Elbow extensors   Electrical Stimulation Action Russian   Electrical Stimulation Parameters 1) 30 mA CC 2) 26 mA CC 3) 21 mA CC   Electrical Stimulation Goals Neuromuscular facilitation   Manual Therapy   Manual Therapy Taping   Kinesiotex Ligament Correction  left deltoid  OT Short Term Goals - 09/10/14 1509    OT SHORT TERM GOAL #1   Title Patient will be educated on a HEP.   Time 6   Period Weeks   Status On-going   OT SHORT TERM GOAL #2   Title Patient will use her left arm as a gross assist with daily activites.   Time 6   Period Weeks   Status On-going   OT SHORT TERM GOAL #3   Title Patient will decrease subluxation in left shoulder to 1.5 digits for decreased pain with shoulder use.   Time 6   Period Weeks   Status On-going   OT SHORT TERM GOAL #4   Title Patient will weightbear on her left arm with max pa while competing functional activiites.    Time 6   Period Weeks   Status On-going   OT SHORT TERM GOAL  #5   Title Patient will have trace mobility in her LUE for increased ability to complete BADLs.   Time 6   Period Weeks   OT SHORT TERM GOAL #6   Title Patient will complete bathing and dressing with close min guard.    Period Weeks   OT SHORT TERM GOAL #7   Title Patient will attend to left side during ADLs and functional ambulation 50% of time.    Period Weeks           OT Long Term Goals - 09/10/14 1509    OT LONG TERM GOAL #1   Title Patient will use her left arm as an active assist with daily tasks.   Time 12   Period Weeks   Status On-going   OT LONG TERM GOAL #2   Title Patient will have 50% volitional movement in her left arm.   Time 12   Period Weeks   Status On-going   OT LONG TERM GOAL #3   Title Patient will be able to weightbear with min pa or less.   Time 12   Period Weeks   Status On-going   OT LONG TERM GOAL #4   Title Patient will decrease subluxation to 1 digit or less in right shoulder region.    Time 12   Period Weeks   Status On-going   OT LONG TERM GOAL #5   Title Patient will attend to left independently.   Time 12   Period Weeks   Status On-going   OT LONG TERM GOAL #6   Title Patient will improve to Brunnstrom stage IV in LUE.   Time 12   Period Weeks   Status On-going   OT LONG TERM GOAL #7   Title Patient wll complete all ADLs and ambulation with Mod I.   Period Weeks               Plan - 10/04/14 1238    Clinical Impression Statement A: Due to visit limit, patient will be decreasing frequency down to 1 time a week to spread out visits.   Plan P: Complete AAROM activity focusing on shoulder and elbow range of motion at table top. Update HEP if needed.         Problem List Patient Active Problem List   Diagnosis Date Noted  . Seizures 12/02/2012  . AVM (arteriovenous malformation) 12/02/2012    Limmie PatriciaLaura Mehki Klumpp, OTR/L,CBIS  [phone]  10/04/2014, 12:42 PM  [facility] [address] [place], [place], [address] Phone: [phone]   Fax:  [phone]    

## 2014-10-06 ENCOUNTER — Encounter (HOSPITAL_COMMUNITY): Payer: Federal, State, Local not specified - PPO | Admitting: Occupational Therapy

## 2014-10-06 ENCOUNTER — Ambulatory Visit (HOSPITAL_COMMUNITY): Payer: Federal, State, Local not specified - PPO | Admitting: Physical Therapy

## 2014-10-08 ENCOUNTER — Encounter (HOSPITAL_COMMUNITY): Payer: Federal, State, Local not specified - PPO

## 2014-10-08 ENCOUNTER — Ambulatory Visit (HOSPITAL_COMMUNITY): Payer: Federal, State, Local not specified - PPO | Admitting: Physical Therapy

## 2014-10-11 ENCOUNTER — Ambulatory Visit (HOSPITAL_COMMUNITY): Payer: Federal, State, Local not specified - PPO | Admitting: Specialist

## 2014-10-11 ENCOUNTER — Encounter (HOSPITAL_COMMUNITY): Payer: Federal, State, Local not specified - PPO | Admitting: Speech Pathology

## 2014-10-11 ENCOUNTER — Ambulatory Visit (HOSPITAL_COMMUNITY): Payer: Federal, State, Local not specified - PPO | Admitting: Physical Therapy

## 2014-10-11 DIAGNOSIS — G8110 Spastic hemiplegia affecting unspecified side: Secondary | ICD-10-CM

## 2014-10-11 DIAGNOSIS — IMO0002 Reserved for concepts with insufficient information to code with codable children: Secondary | ICD-10-CM

## 2014-10-11 DIAGNOSIS — R269 Unspecified abnormalities of gait and mobility: Secondary | ICD-10-CM

## 2014-10-11 DIAGNOSIS — I69898 Other sequelae of other cerebrovascular disease: Secondary | ICD-10-CM | POA: Diagnosis not present

## 2014-10-11 DIAGNOSIS — R29898 Other symptoms and signs involving the musculoskeletal system: Secondary | ICD-10-CM

## 2014-10-11 DIAGNOSIS — R2689 Other abnormalities of gait and mobility: Secondary | ICD-10-CM

## 2014-10-11 NOTE — Therapy (Signed)
[place] [facility] [address] [place], [place], [address] Phone: [phone]   Fax:  [phone]  Physical Therapy Treatment  Patient Details  Name: Jennifer Pope MRN: [id number] Date of Birth: November 24, 1970 Referring Provider:  Derrell Lollingrenstein, Raphael, MD  Encounter Date: 10/11/2014      PT End of Session - 10/11/14 1108    Visit Number 17   Number of Visits 20   Date for PT Re-Evaluation 11/01/14   Authorization Type BCBS   Authorization - Visit Number 17   Authorization - Number of Visits 20   PT Start Time 1021   PT Stop Time 1100   PT Time Calculation (min) 39 min   Equipment Utilized During Treatment Gait belt   Activity Tolerance Patient tolerated treatment well   Behavior During Therapy [facility] for tasks assessed/performed      Past Medical History  Diagnosis Date  . Seizures   . Arteriovenous malformation   . Stroke     Past Surgical History  Procedure Laterality Date  . Ankle fracture surgery Right     2002    There were no vitals filed for this visit.  Visit Diagnosis:  Weakness due to cerebrovascular accident  Abnormality of gait  Left leg weakness  Decreased functional mobility      Subjective Assessment - 10/11/14 1025    Subjective Pt reports that she has been using the cane only when she has somebody else around, because she still feels a little unstable using it.    Currently in Pain? No/denies              [facility] Adult PT Treatment/Exercise - 10/11/14 0001    Ambulation/Gait   Ambulation/Gait Yes   Ambulation/Gait Assistance 4: Min guard   Ambulation Distance (Feet) 200 Feet  2 bouts, one with SPC, one with no AD   Assistive device Straight cane;None  SPC and no device    Knee/Hip Exercises: Standing   Rocker Board Limitations Lateral only x20 with Min guard and U HHA    Other Standing Knee Exercises tap ups at 8" box to increase hip/knee flexion x 10, cone rotation standing on airex x 2   Other Standing Knee Exercises stepping over dowels x 5 RT, over dowel and onto foam pads x 2 RT                PT Education - 10/11/14 1107    Education provided Yes   Education Details Added stepping over 2-3 inch hurdles to HEP   Person(s) Educated Patient;Spouse   Methods Explanation   Comprehension Verbalized understanding          PT Short Term Goals - 10/04/14 1032    PT SHORT TERM GOAL #1   Title Patient will demosntrate increased ankel dorsiflexion strength of 2/5 MMT to decrease risk of trippign over Lt foot due to Lt foot drop.   Time 4   Period Weeks   Status On-going   PT SHORT TERM GOAL #2   Title Patient will demonstrate increased Lt hamstring/calf strength of 4-/5 MMT to decrease risk of Lt knee hyper extension.    Time 4   Period Weeks   Status On-going   PT SHORT TERM GOAL #3   Title Patient will dmeosntrate increased Lt hip flexion of 4/5 MMT to be able to ambulate with improved stride length.    Time 4   Period Weeks   Status On-going   PT SHORT TERM GOAL #  4   Title patient will be abel to ambualte with a SPC for 100 ft   Baseline 7/11- 290ft with SPC    Period Weeks   Status Achieved   PT SHORT TERM GOAL #5   Title I with HEP for progression of strength   Baseline 7/11- continuing to do it every day,    Time 4   Period Weeks   Status Achieved           PT Long Term Goals - 10/04/14 1038    PT LONG TERM GOAL #1   Title Patient will demonstrate increased ankle dorsiflexion strength of 3/5 MMT to decrease risk of tripping over Lt foot due to Lt foot drop   Time 8   Period Weeks   Status On-going   PT LONG TERM GOAL #2   Title Patient will demonstrate increased Lt hamstring/calf strength of 5/5 MMT to decrease risk of Lt knee hyper extension.    Time 8   Period Weeks   Status On-going   PT LONG TERM GOAL #3   Title Patient will demosntrate increased Lt hip extension of 4/5 MMT to be able to ambulate with improved stride length and  perofmr sit to stand without dependence of Rt LE for perofrmance of sit to stand.    Time 8   Period Weeks   Status On-going   PT LONG TERM GOAL #4   Title Patient will be able to ambulate up and down stairs with only 1 HHA.    Baseline 7/11- patient able to navigate stairs with U railing    Time 8   Period Weeks   Status Achieved   PT LONG TERM GOAL #5   Title Patient will demonstrate increased abdominal strength of 5/5 MMT to be able ride horses.    Time 8   Period Weeks   Status On-going               Plan - 10/11/14 1109    Clinical Impression Statement Treatment focused on improving foot clearance and hip/knee flexion during gait. Pt was able to ambulate with good mechanics when using SPC, however, when she ambulated without AD, she required increased verbal cueing for foot clearance and step-through gait pattern. Pt did very well with stepping over dowels with SBQC and SPC, which gave her a 2 inch hurdle to step over, demonstrating improvements in foot clearance with ambulation with AD.    PT Frequency Other (comment)  3 more sessions due to insurance limitations   PT Next Visit Plan Continue with gait training over hurdles, balance and proprioceptive training        Problem List Patient Active Problem List   Diagnosis Date Noted  . Seizures 12/02/2012  . AVM (arteriovenous malformation) 12/02/2012    Leona Singleton, PT, DPT [phone] 10/11/2014, 11:21 AM   [facility] [address] [place], [place], [address] Phone: [phone]   Fax:  [phone]

## 2014-10-11 NOTE — Therapy (Signed)
[facility] [address] [place], [place], [address] Phone: [phone]   Fax:  [phone]  Occupational Therapy Treatment  Patient Details  Name: Jennifer Pope MRN: [id number] Date of Birth: Apr 25, 1970 Referring Provider:  Derrell Lolling, MD  Encounter Date: 10/11/2014      OT End of Session - 10/11/14 1250    Visit Number 22   Number of Visits 36   Date for OT Re-Evaluation 12/10/14  mini reassess 11/09/14   Authorization Type BCBS    Authorization Time Period 50 visit limit for PT/OT/SLP (change visit number to reflect all disciplines with each tx session)   Authorization - Visit Number 44   Authorization - Number of Visits 50   OT Start Time 1103   OT Stop Time 1150   OT Time Calculation (min) 47 min   Activity Tolerance Patient tolerated treatment well   Behavior During Therapy [facility] for tasks assessed/performed      Past Medical History  Diagnosis Date  . Seizures   . Arteriovenous malformation   . Stroke     Past Surgical History  Procedure Laterality Date  . Ankle fracture surgery Right     2002    There were no vitals filed for this visit.  Visit Diagnosis:  Weakness due to cerebrovascular accident - Plan: Ot plan of care cert/re-cert  Spastic hemiplegia affecting nondominant side - Plan: Ot plan of care cert/re-cert      Subjective Assessment - 10/11/14 1241    Subjective  S:  Its so much easier to move my arm in towards me than it is straight out in front.   Currently in Pain? 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 Helmet on at all times when ambulating.    Prior Function   Level of Independence Independent with basic ADLs   ADL   ADL comments Ashayla is now able to spend extended periods of time at home  alone.  She ambulates with a Straight [employer] or a hemiwalker.  She is able to dress and bathe herself with extended time to complete the tasks.  She is unable to use her left arm actively with daily activities.  She uses it as a gross assist.                    OT Treatments/Exercises (OP) - 10/11/14 0001    ADLs   Eating with hand over hand assist, Beth grasped a plastic cup with her left hand and brought it to her mouth with mod hand over hand assist for maintaining cup in upright position and min pa for shoulder flexion.     Shoulder Exercises: Seated   Elevation AROM;10 reps   Other Seated Exercises scapular retraction 10 times with min facilitaiton    Neurological Re-education Exercises   Scapular Stabilization Seated   Shoulder Flexion AAROM;10 reps   Elbow Flexion AAROM;10 reps   Elbow Extension AAROM;10 reps   Forearm Supination AROM;10 reps   Forearm Pronation AROM;10 reps   Wrist Flexion AAROM;10 reps   Wrist Extension AAROM;10 reps   Development of Reach Closed chain   Closed Chain Exercises worked on coordinating shoulder flexion to 90, elbow extension, wrist extension for reaching.  Able  to complete shoulder flexion and elbow extension with unweightning.  Max difficulty combining the 2 movements together.    Manual Therapy   Myofascial Release Myofascial release to left scapular region to decrease restrictions                  OT Short Term Goals - 10/11/14 1253    OT SHORT TERM GOAL #1   Title Patient will be educated on a HEP.   Time 6   Period Weeks   Status Achieved   OT SHORT TERM GOAL #2   Title Patient will use her left arm as a gross assist with daily activites.   Time 6   Period Weeks   Status Achieved   OT SHORT TERM GOAL #3   Title Patient will decrease subluxation in left shoulder to 1.5 digits for decreased pain with shoulder use.   Time 6   Period Weeks   Status Achieved   OT SHORT TERM GOAL #4   Title Patient will  weightbear on her left arm with max pa while competing functional activiites.    Time 6   Period Weeks   Status Achieved   OT SHORT TERM GOAL #5   Title Patient will have trace mobility in her LUE for increased ability to complete BADLs.   Time 6   Period Weeks   Status Achieved   OT SHORT TERM GOAL #6   Title Patient will complete bathing and dressing with close min guard.    Period Weeks   Status Achieved   OT SHORT TERM GOAL #7   Title Patient will attend to left side during ADLs and functional ambulation 50% of time.    Period Weeks   Status Achieved           OT Long Term Goals - 10/11/14 1254    OT LONG TERM GOAL #1   Title Patient will use her left arm as an active assist with daily tasks.   Time 12   Period Weeks   Status On-going   OT LONG TERM GOAL #2   Title Patient will have 50% volitional movement in her left arm.   Time 12   Period Weeks   Status On-going   OT LONG TERM GOAL #3   Title Patient will be able to weightbear with min pa or less.   Time 12   Period Weeks   Status On-going   OT LONG TERM GOAL #4   Title Patient will decrease subluxation to 1 digit or less in right shoulder region.    Time 12   Period Weeks   Status On-going   OT LONG TERM GOAL #5   Title Patient will attend to left independently.   Time 12   Period Weeks   Status Achieved   OT LONG TERM GOAL #6   Title Patient will improve to Brunnstrom stage IV in LUE.   Time 12   Period Weeks   Status On-going   OT LONG TERM GOAL #7   Title Patient wll complete all ADLs and ambulation with Mod I.   Period Weeks               Plan - 10/11/14 1250    Clinical Impression Statement A:  Beth is making significant improvements in her independence with ADL completion using compensatory techniques.  She is able to dress and bathe herself, stay home alone for several hours each day, and make a cold sandwich independently.  She  has improved PROM, AAROM, and AROM in her shoulder,  elbow, and forearm.  Her wrist and hand AAROM are improving and do not have as signfiicant improvments as the rest of her UE.     Pt will benefit from skilled therapeutic intervention in order to improve on the following deficits (Retired) Decreased coordination;Decreased endurance;Decreased range of motion;Decreased strength;Impaired tone;Impaired UE functional use;Impaired vision/preception   OT Frequency 1x / week   OT Duration 8 weeks   OT Treatment/Interventions Self-care/ADL training;Electrical Stimulation;Moist Heat;Therapeutic exercise;Neuromuscular education;Energy conservation;DME and/or AE instruction;Passive range of motion;Therapeutic exercises;Patient/family education;Therapeutic activities;Visual/perceptual remediation/compensation   Plan P:  Continue skilled OT intervention to improve to active assist use of LUE with daily activities.  Next visit, assess AROM and PROM in supine.   Consulted and Agree with Plan of Care Patient        Problem List Patient Active Problem List   Diagnosis Date Noted  . Seizures 12/02/2012  . AVM (arteriovenous malformation) 12/02/2012    Shirlean MylarBethany H. Murray, OTR/L [phone]  10/11/2014, 12:59 PM   [facility] [address] , [place], [address] Phone: [phone]   Fax:  [phone]  Occupational Therapy Progress Note  Dates of Reporting Period: 08/09/14 to 10/09/14  Objective Reports of Subjective Statement: see above  Objective Measurements: see above  Goal Update: see above Plan: see above  Reason Skilled Services are Required: Through skilled OT intervention we will continue to improve volitional movement in Xiadani's LUE so that she can actively use her LUE with all B/IADL, work, and leisure activities.  Shirlean MylarBethany H. Murray, OTR/L [phone]

## 2014-10-13 ENCOUNTER — Encounter (HOSPITAL_COMMUNITY): Payer: Federal, State, Local not specified - PPO | Admitting: Occupational Therapy

## 2014-10-13 ENCOUNTER — Ambulatory Visit (HOSPITAL_COMMUNITY): Payer: Federal, State, Local not specified - PPO | Admitting: Physical Therapy

## 2014-10-15 ENCOUNTER — Encounter (HOSPITAL_COMMUNITY): Payer: Federal, State, Local not specified - PPO

## 2014-10-15 ENCOUNTER — Ambulatory Visit (HOSPITAL_COMMUNITY): Payer: Federal, State, Local not specified - PPO | Admitting: Physical Therapy

## 2014-10-18 ENCOUNTER — Encounter (HOSPITAL_COMMUNITY): Payer: Federal, State, Local not specified - PPO

## 2014-10-18 ENCOUNTER — Encounter (HOSPITAL_COMMUNITY): Payer: Federal, State, Local not specified - PPO | Admitting: Speech Pathology

## 2014-10-18 ENCOUNTER — Ambulatory Visit (HOSPITAL_COMMUNITY): Payer: Federal, State, Local not specified - PPO | Admitting: Physical Therapy

## 2014-10-20 ENCOUNTER — Ambulatory Visit (HOSPITAL_COMMUNITY): Payer: Federal, State, Local not specified - PPO | Admitting: Physical Therapy

## 2014-10-20 ENCOUNTER — Encounter (HOSPITAL_COMMUNITY): Payer: Federal, State, Local not specified - PPO | Admitting: Occupational Therapy

## 2014-10-22 ENCOUNTER — Encounter (HOSPITAL_COMMUNITY): Payer: Federal, State, Local not specified - PPO | Admitting: Occupational Therapy

## 2014-10-22 ENCOUNTER — Ambulatory Visit (HOSPITAL_COMMUNITY): Payer: Federal, State, Local not specified - PPO | Admitting: Physical Therapy

## 2014-10-25 ENCOUNTER — Ambulatory Visit (HOSPITAL_COMMUNITY): Payer: Federal, State, Local not specified - PPO | Admitting: Specialist

## 2014-10-25 ENCOUNTER — Ambulatory Visit (HOSPITAL_COMMUNITY)
Payer: Federal, State, Local not specified - PPO | Attending: Physical Medicine and Rehabilitation | Admitting: Physical Therapy

## 2014-10-25 DIAGNOSIS — G8110 Spastic hemiplegia affecting unspecified side: Secondary | ICD-10-CM

## 2014-10-25 DIAGNOSIS — I69898 Other sequelae of other cerebrovascular disease: Secondary | ICD-10-CM | POA: Insufficient documentation

## 2014-10-25 DIAGNOSIS — R29898 Other symptoms and signs involving the musculoskeletal system: Secondary | ICD-10-CM

## 2014-10-25 DIAGNOSIS — S43112S Subluxation of left acromioclavicular joint, sequela: Secondary | ICD-10-CM | POA: Insufficient documentation

## 2014-10-25 DIAGNOSIS — R2689 Other abnormalities of gait and mobility: Secondary | ICD-10-CM | POA: Insufficient documentation

## 2014-10-25 DIAGNOSIS — R531 Weakness: Secondary | ICD-10-CM | POA: Insufficient documentation

## 2014-10-25 DIAGNOSIS — R269 Unspecified abnormalities of gait and mobility: Secondary | ICD-10-CM | POA: Insufficient documentation

## 2014-10-25 DIAGNOSIS — R262 Difficulty in walking, not elsewhere classified: Secondary | ICD-10-CM | POA: Diagnosis present

## 2014-10-25 DIAGNOSIS — IMO0002 Reserved for concepts with insufficient information to code with codable children: Secondary | ICD-10-CM

## 2014-10-25 NOTE — Therapy (Signed)
[facility] [address] [place], [place], [address] Phone: [phone]   Fax:  [phone]  Physical Therapy Treatment  Patient Details  Name: Jennifer Pope MRN: [id number] Date of Birth: December 22, 1970 Referring Provider:  Derrell Lolling, MD  Encounter Date: 10/25/2014      PT End of Session - 10/25/14 1157    Visit Number 18   Number of Visits 20   Date for PT Re-Evaluation 11/01/14   Authorization Type BCBS   Authorization - Visit Number 18   Authorization - Number of Visits 20   PT Start Time 1020   PT Stop Time 1100   PT Time Calculation (min) 40 min   Equipment Utilized During Treatment Gait belt   Activity Tolerance Patient tolerated treatment well   Behavior During Therapy [facility] for tasks assessed/performed      Past Medical History  Diagnosis Date  . Seizures   . Arteriovenous malformation   . Stroke     Past Surgical History  Procedure Laterality Date  . Ankle fracture surgery Right     2002    There were no vitals filed for this visit.  Visit Diagnosis:  Weakness due to cerebrovascular accident  Abnormality of gait  Left leg weakness  Decreased functional mobility      Subjective Assessment - 10/25/14 1025    Subjective Pt reports that she has been using SBQC for ambulation, and she has concerns that her AFO is hindering her ability to strengthen her leg. She denies any recent falls, and has been ambulating without AD in the house. She is going to see her MD next Wednesday to determine when her skull flap will be replaced.                [facility] Adult PT Treatment/Exercise - 10/25/14 0001    Ambulation/Gait   Ambulation/Gait Yes   Ambulation/Gait Assistance 4: Min guard   Ambulation Distance (Feet) 200 Feet   Assistive device None   Gait Pattern Decreased hip/knee flexion - left;Decreased dorsiflexion - left;Left circumduction;Wide base of support;Poor foot clearance - left   Knee/Hip  Exercises: Standing   Other Standing Knee Exercises tap ups at 8" box on airex pad, cone rotation on airex pad from 8" box to tray table   Other Standing Knee Exercises stepping over 4" hurdles, sidestepping outside // bars x 2 RT                 PT Education - 10/25/14 1156    Education provided Yes   Education Details Progressed HEP to stepping over 4" hurdles   Person(s) Educated Patient;Spouse   Methods Explanation   Comprehension Verbalized understanding          PT Short Term Goals - 10/04/14 1032    PT SHORT TERM GOAL #1   Title Patient will demosntrate increased ankel dorsiflexion strength of 2/5 MMT to decrease risk of trippign over Lt foot due to Lt foot drop.   Time 4   Period Weeks   Status On-going   PT SHORT TERM GOAL #2   Title Patient will demonstrate increased Lt hamstring/calf strength of 4-/5 MMT to decrease risk of Lt knee hyper extension.    Time 4   Period Weeks   Status On-going   PT SHORT TERM GOAL #3   Title Patient will dmeosntrate increased Lt hip flexion of 4/5 MMT to be able to ambulate with improved stride length.    Time 4  Period Weeks   Status On-going   PT SHORT TERM GOAL #4   Title patient will be abel to ambualte with a SPC for 100 ft   Baseline 7/11- 245ft with SPC    Period Weeks   Status Achieved   PT SHORT TERM GOAL #5   Title I with HEP for progression of strength   Baseline 7/11- continuing to do it every day,    Time 4   Period Weeks   Status Achieved           PT Long Term Goals - 10/04/14 1038    PT LONG TERM GOAL #1   Title Patient will demonstrate increased ankle dorsiflexion strength of 3/5 MMT to decrease risk of tripping over Lt foot due to Lt foot drop   Time 8   Period Weeks   Status On-going   PT LONG TERM GOAL #2   Title Patient will demonstrate increased Lt hamstring/calf strength of 5/5 MMT to decrease risk of Lt knee hyper extension.    Time 8   Period Weeks   Status On-going   PT LONG TERM  GOAL #3   Title Patient will demosntrate increased Lt hip extension of 4/5 MMT to be able to ambulate with improved stride length and perofmr sit to stand without dependence of Rt LE for perofrmance of sit to stand.    Time 8   Period Weeks   Status On-going   PT LONG TERM GOAL #4   Title Patient will be able to ambulate up and down stairs with only 1 HHA.    Baseline 7/11- patient able to navigate stairs with U railing    Time 8   Period Weeks   Status Achieved   PT LONG TERM GOAL #5   Title Patient will demonstrate increased abdominal strength of 5/5 MMT to be able ride horses.    Time 8   Period Weeks   Status On-going               Plan - 10/25/14 1158    Clinical Impression Statement Pt demonstrated improved foot clearance during gait training in today's treatment session. She required cueing to faciltiate hip flexors in order to prevent circumduction with gait training and with stepping over hurdles. Pt was educated on increasing hip flexion during functional activities to improve foot clearance, and on performing ankle ABC's with assistance from husband to increase proprioception of L ankle.    PT Next Visit Plan Progress gait training on uneven surface        Problem List Patient Active Problem List   Diagnosis Date Noted  . Seizures 12/02/2012  . AVM (arteriovenous malformation) 12/02/2012    Leona Singleton, PT, DPT [phone] 10/25/2014, 12:09 PM  [facility] [address] [place], [place], [address] Phone: [phone]   Fax:  [phone]

## 2014-10-25 NOTE — Therapy (Signed)
[facility] [address] [place], [place], [address] Phone: [phone]   Fax:  [phone]  Occupational Therapy Treatment  Patient Details  Name: Jennifer Pope MRN: [id number] Date of Birth: 1971/02/23 Referring Provider:  Derrell Lolling, MD  Encounter Date: 10/25/2014      OT End of Session - 10/25/14 1232    Visit Number 23   Number of Visits 36   Date for OT Re-Evaluation 12/10/14  mini reassess on 11/09/14   Authorization Type BCBS    Authorization Time Period 50 visit limit for PT/OT/SLP (change visit number to reflect all disciplines with each tx session)   Authorization - Visit Number 46   Authorization - Number of Visits 50   OT Start Time 1105   OT Stop Time 1155   OT Time Calculation (min) 50 min   Activity Tolerance Patient tolerated treatment well   Behavior During Therapy [facility] for tasks assessed/performed      Past Medical History  Diagnosis Date  . Seizures   . Arteriovenous malformation   . Stroke     Past Surgical History  Procedure Laterality Date  . Ankle fracture surgery Right     2002    There were no vitals filed for this visit.  Visit Diagnosis:  Weakness due to cerebrovascular accident  Subluxation of left acromioclavicular joint, sequela  Spastic hemiplegia affecting nondominant side      Subjective Assessment - 10/25/14 1225    Subjective  S:  I have really been working hard on my arm. Its so much harder than learning to walk.  Husband reports that our session 2 weeks ago was the first time that [facility] fully understood that 100% recovery is not a given.     Currently in Pain? No/denies            [facility] OT Assessment - 10/25/14 0001    Assessment   Diagnosis [facility] secondary to AVM with left side hemiparesis   Precautions   Precautions Fall;Other (comment)   Precaution Comments left side neglect. Helmet on at all times when ambulating.    AROM   Overall AROM Comments assessed  in supine   Left Shoulder Extension --  WFL   Left Shoulder Flexion 30 Degrees   Left Shoulder ABduction 70 Degrees   Left Shoulder Internal Rotation 80 Degrees   Left Shoulder External Rotation --  to neutral   Left Elbow Flexion 120   Left Elbow Extension 0   Left Forearm Pronation 90 Degrees   Left Forearm Supination 60 Degrees   Left Wrist Extension 0 Degrees   Left Wrist Flexion 70 Degrees   Left Composite Finger Extension 25%   Left Composite Finger Flexion 75%   PROM   Overall PROM Comments PROM of LUE is WFL                  OT Treatments/Exercises (OP) - 10/25/14 0001    Neurological Re-education Exercises   Other Grasp and Release Exercises  sitting at table, patient was able to grasp 1" blocks with min pa from OT, maintain grasp on blocks 80% of the time and required max pa to release blocks. Completed 10 times with hand over hand assist from OT and 5 times with occassional assist.    Functional Reaching Activities   Low Level while seated, closed chain technique used while patient reached forward to table to grasp block and place block in container.  With closed chain technique,  patient able to complete reaching task X 10 attempts.  Without closed chain assist, patient has max difficulty and increases use of compensatory shoulder and body movements.    Manual Therapy   Manual Therapy Myofascial release   Myofascial Release Myofascial release to left scapular region to decrease restrictions                  OT Short Term Goals - 10/11/14 1253    OT SHORT TERM GOAL #1   Title Patient will be educated on a HEP.   Time 6   Period Weeks   Status Achieved   OT SHORT TERM GOAL #2   Title Patient will use her left arm as a gross assist with daily activites.   Time 6   Period Weeks   Status Achieved   OT SHORT TERM GOAL #3   Title Patient will decrease subluxation in left shoulder to 1.5 digits for decreased pain with shoulder use.   Time 6    Period Weeks   Status Achieved   OT SHORT TERM GOAL #4   Title Patient will weightbear on her left arm with max pa while competing functional activiites.    Time 6   Period Weeks   Status Achieved   OT SHORT TERM GOAL #5   Title Patient will have trace mobility in her LUE for increased ability to complete BADLs.   Time 6   Period Weeks   Status Achieved   OT SHORT TERM GOAL #6   Title Patient will complete bathing and dressing with close min guard.    Period Weeks   Status Achieved   OT SHORT TERM GOAL #7   Title Patient will attend to left side during ADLs and functional ambulation 50% of time.    Period Weeks   Status Achieved           OT Long Term Goals - 10/11/14 1254    OT LONG TERM GOAL #1   Title Patient will use her left arm as an active assist with daily tasks.   Time 12   Period Weeks   Status On-going   OT LONG TERM GOAL #2   Title Patient will have 50% volitional movement in her left arm.   Time 12   Period Weeks   Status On-going   OT LONG TERM GOAL #3   Title Patient will be able to weightbear with min pa or less.   Time 12   Period Weeks   Status On-going   OT LONG TERM GOAL #4   Title Patient will decrease subluxation to 1 digit or less in right shoulder region.    Time 12   Period Weeks   Status On-going   OT LONG TERM GOAL #5   Title Patient will attend to left independently.   Time 12   Period Weeks   Status Achieved   OT LONG TERM GOAL #6   Title Patient will improve to Brunnstrom stage IV in LUE.   Time 12   Period Weeks   Status On-going   OT LONG TERM GOAL #7   Title Patient wll complete all ADLs and ambulation with Mod I.   Period Weeks               Plan - 10/25/14 1233    Clinical Impression Statement A:  Patient has improved volitional movement in her left arm this date.  Her biggest obstacle is her inability to release her grasp on  object.     Plan P:  Use saebo splint for digit extension during reaching and grasping  activities to improve ability to extend digits independently.  Shorten resting hand splint.    Consulted and Agree with Plan of Care Patient        Problem List Patient Active Problem List   Diagnosis Date Noted  . Seizures 12/02/2012  . AVM (arteriovenous malformation) 12/02/2012    Shirlean Mylar, OTR/L [phone]  10/25/2014, 12:35 PM  [facility] [address] [place], [place], [address] Phone: [phone]   Fax:  [phone]

## 2014-11-01 ENCOUNTER — Ambulatory Visit (HOSPITAL_COMMUNITY): Payer: Federal, State, Local not specified - PPO | Admitting: Physical Therapy

## 2014-11-01 ENCOUNTER — Encounter (HOSPITAL_COMMUNITY): Payer: Self-pay

## 2014-11-01 ENCOUNTER — Ambulatory Visit (HOSPITAL_COMMUNITY): Payer: Federal, State, Local not specified - PPO

## 2014-11-01 DIAGNOSIS — G8110 Spastic hemiplegia affecting unspecified side: Secondary | ICD-10-CM

## 2014-11-01 DIAGNOSIS — I69898 Other sequelae of other cerebrovascular disease: Secondary | ICD-10-CM | POA: Diagnosis not present

## 2014-11-01 DIAGNOSIS — R269 Unspecified abnormalities of gait and mobility: Secondary | ICD-10-CM

## 2014-11-01 DIAGNOSIS — R29898 Other symptoms and signs involving the musculoskeletal system: Secondary | ICD-10-CM

## 2014-11-01 DIAGNOSIS — IMO0002 Reserved for concepts with insufficient information to code with codable children: Secondary | ICD-10-CM

## 2014-11-01 DIAGNOSIS — R2689 Other abnormalities of gait and mobility: Secondary | ICD-10-CM

## 2014-11-01 DIAGNOSIS — R262 Difficulty in walking, not elsewhere classified: Secondary | ICD-10-CM

## 2014-11-01 NOTE — Patient Instructions (Signed)
Knee Flexion / Extension   Lift left heel toward buttocks, keeping knee straight down from hip. Straighten knee, pushing foot down to start position. Repeat sequence _15-30___ times per session.    Ankle Dorsiflexion / Plantar Flexion   Raise left leg forward to comfortable height, knee straight. Pull foot up then push it down, moving at ankle only. Repeat sequence __15-30__ times per session.  Position: Standing    Ankle / Toe Writing   Raise right leg forward to comfortable height, knee bent. Moving foot at the ankle, write letters of your name or other words. Write for __2-3__ minutes to complete session.  Position: Standing     Hip Extension, Knee Straight   Move right straight leg back. Repeat __15-30__ times per session.    Hip Flexion, Knee Bent   Lift right leg toward chest with knee bent. Repeat __15-30__ times per session.

## 2014-11-01 NOTE — Therapy (Signed)
[facility] [address] [place], [place], [address] Phone: [phone]   Fax:  [phone]  Physical Therapy Treatment  Patient Details  Name: Jennifer Pope MRN: [id number] Date of Birth: [birth year] Referring Provider:  Derrell Lolling, MD  Encounter Date: 11/01/2014      PT End of Session - 11/01/14 1115    Visit Number 19   Number of Visits 20   Date for PT Re-Evaluation 11/08/14   Authorization Type BCBS   Authorization - Visit Number 19   Authorization - Number of Visits 20   PT Start Time 1015   PT Stop Time 1105   PT Time Calculation (min) 50 min   Equipment Utilized During Treatment Gait belt   Activity Tolerance Patient tolerated treatment well   Behavior During Therapy [facility] for tasks assessed/performed      Past Medical History  Diagnosis Date  . Seizures   . Arteriovenous malformation   . Stroke     Past Surgical History  Procedure Laterality Date  . Ankle fracture surgery Right     2002    There were no vitals filed for this visit.  Visit Diagnosis:  Weakness due to cerebrovascular accident  Spastic hemiplegia affecting nondominant side  Abnormality of gait  Left leg weakness  Decreased functional mobility  Difficulty walking      Subjective Assessment - 11/01/14 1015    Subjective Pt reports that she has some increased fatigue today, but denies any pain. She has been walking without her cane inside the house and a little bit outside of the home as well. She reports that she has good days and bad days.  She has an appt with her MD tomorrow regarding her skull cap, and she is hoping that her surgery will be scheduled to replace the skull cap.  She reports that she has been having really bad headaches for the past 2 weeks, mainly when she bends over a lot, and the only way to get rid of the headache is to lay down supine.    Currently in Pain? No/denies   Pain Score 0-No pain                  [facility] Adult PT Treatment/Exercise - 11/01/14 0001    Ambulation/Gait   Ambulation/Gait Yes   Ambulation/Gait Assistance 4: Min guard   Ambulation Distance (Feet) 450 Feet   Assistive device None   Gait Pattern Decreased hip/knee flexion - left;Decreased dorsiflexion - left;Left circumduction;Wide base of support;Poor foot clearance - left   Knee/Hip Exercises: Standing   Other Standing Knee Exercises tap ups at 8" box on airex pad, cone rotation on airex pad from stool to top shelf   Other Standing Knee Exercises stepping over 4" hurdles, sidestepping over hurldes x 2 RT, sidestepping outside // bars x 2 RT    Knee/Hip Exercises: Seated   Heel Slides 10 reps   Heel Slides Limitations AROM with heel on pillowcase on floor                PT Education - 11/01/14 1115    Education provided Yes   Education Details Given HEP for aquatic exercises to improve hamstring/ ankle strength   Person(s) Educated Patient   Methods Explanation;Handout   Comprehension Verbalized understanding          PT Short Term Goals - 10/04/14 1032    PT SHORT TERM GOAL #1   Title Patient will demosntrate increased ankel  dorsiflexion strength of 2/5 MMT to decrease risk of trippign over Lt foot due to Lt foot drop.   Time 4   Period Weeks   Status On-going   PT SHORT TERM GOAL #2   Title Patient will demonstrate increased Lt hamstring/calf strength of 4-/5 MMT to decrease risk of Lt knee hyper extension.    Time 4   Period Weeks   Status On-going   PT SHORT TERM GOAL #3   Title Patient will dmeosntrate increased Lt hip flexion of 4/5 MMT to be able to ambulate with improved stride length.    Time 4   Period Weeks   Status On-going   PT SHORT TERM GOAL #4   Title patient will be abel to ambualte with a SPC for 100 ft   Baseline 7/11- 246ft with SPC    Period Weeks   Status Achieved   PT SHORT TERM GOAL #5   Title I with HEP for progression of strength   Baseline  7/11- continuing to do it every day,    Time 4   Period Weeks   Status Achieved           PT Long Term Goals - 10/04/14 1038    PT LONG TERM GOAL #1   Title Patient will demonstrate increased ankle dorsiflexion strength of 3/5 MMT to decrease risk of tripping over Lt foot due to Lt foot drop   Time 8   Period Weeks   Status On-going   PT LONG TERM GOAL #2   Title Patient will demonstrate increased Lt hamstring/calf strength of 5/5 MMT to decrease risk of Lt knee hyper extension.    Time 8   Period Weeks   Status On-going   PT LONG TERM GOAL #3   Title Patient will demosntrate increased Lt hip extension of 4/5 MMT to be able to ambulate with improved stride length and perofmr sit to stand without dependence of Rt LE for perofrmance of sit to stand.    Time 8   Period Weeks   Status On-going   PT LONG TERM GOAL #4   Title Patient will be able to ambulate up and down stairs with only 1 HHA.    Baseline 7/11- patient able to navigate stairs with U railing    Time 8   Period Weeks   Status Achieved   PT LONG TERM GOAL #5   Title Patient will demonstrate increased abdominal strength of 5/5 MMT to be able ride horses.    Time 8   Period Weeks   Status On-going               Plan - 11/01/14 1116    Clinical Impression Statement Pt was able to ambulate today with improved step-through pattern without AD when cued by PT. She continues to struggle with foot clearance when stepping over hurdles due to decreased strength of hamstrings and dorsiflexors. Pt responded well to balance training, required min A to prevent LOB when completing tasks on compliant surface.    PT Next Visit Plan Reassess next visit for d/c        Problem List Patient Active Problem List   Diagnosis Date Noted  . Seizures 12/02/2012  . AVM (arteriovenous malformation) 12/02/2012    Leona Singleton, PT, DPT [phone] 11/01/2014, 11:27 AM  [facility] [address] [place], [place], [address] Phone: [phone]   Fax:  [phone]

## 2014-11-01 NOTE — Therapy (Signed)
[facility] [address] [place], [place], [address] Phone: [phone]   Fax:  [phone]  Occupational Therapy Treatment  Patient Details  Name: ADELIN VENTRELLA MRN: [id number] Date of Birth: 23-Feb-1971 Referring Provider:  Derrell Lolling, MD  Encounter Date: 11/01/2014      OT End of Session - 11/01/14 1445    Visit Number 24   Number of Visits 36   Date for OT Re-Evaluation 12/10/14  mini reassess on 11/09/14   Authorization Type BCBS    Authorization Time Period 50 visit limit for PT/OT/SLP (change visit number to reflect all disciplines with each tx session)   Authorization - Visit Number 48   Authorization - Number of Visits 50   OT Start Time 0934   OT Stop Time 1015   OT Time Calculation (min) 41 min   Activity Tolerance Patient tolerated treatment well   Behavior During Therapy [facility] for tasks assessed/performed      Past Medical History  Diagnosis Date  . Seizures   . Arteriovenous malformation   . Stroke     Past Surgical History  Procedure Laterality Date  . Ankle fracture surgery Right     2002    There were no vitals filed for this visit.  Visit Diagnosis:  Weakness due to cerebrovascular accident      Subjective Assessment - 11/01/14 1043    Subjective  S: I try to involve my arm as much as I can during the day when I can.    Currently in Pain? No/denies                      OT Treatments/Exercises (OP) - 11/01/14 0001    Exercises   Exercises Neurological Re-education;Shoulder;Elbow   Functional Reaching Activities   Low Level while seated, closed chain technique used while patient reached forward to table to grasp block and place square sponge in container. Therapist provided support to left arm to off load weight of arm. Therapist required to provided hand over hand assist to open and extend fingers. With set-up, patient was able grasp sponge using lateral pinch. Rest breaks  taken to stretch out left hand and digits frequently. Technique was also used when completing large colored pegboard. Increased difficulty with large pegboard as patient was unable to grasp using a lateral pinch. With set up was able to use a lateral pinch of 2nd and 3rd digits.                  OT Short Term Goals - 11/01/14 1447    OT SHORT TERM GOAL #1   Title Patient will be educated on a HEP.   Time 6   Period Weeks   OT SHORT TERM GOAL #2   Title Patient will use her left arm as a gross assist with daily activites.   Time 6   Period Weeks   OT SHORT TERM GOAL #3   Title Patient will decrease subluxation in left shoulder to 1.5 digits for decreased pain with shoulder use.   Time 6   Period Weeks   OT SHORT TERM GOAL #4   Title Patient will weightbear on her left arm with max pa while competing functional activiites.    Time 6   Period Weeks   OT SHORT TERM GOAL #5   Title Patient will have trace mobility in her LUE for increased ability to complete BADLs.   Time 6   Period  Weeks   OT SHORT TERM GOAL #6   Title Patient will complete bathing and dressing with close min guard.    Period Weeks   OT SHORT TERM GOAL #7   Title Patient will attend to left side during ADLs and functional ambulation 50% of time.    Period Weeks           OT Long Term Goals - 11/01/14 1448    OT LONG TERM GOAL #1   Title Patient will use her left arm as an active assist with daily tasks.   Time 12   Period Weeks   Status On-going   OT LONG TERM GOAL #2   Title Patient will have 50% volitional movement in her left arm.   Time 12   Period Weeks   Status On-going   OT LONG TERM GOAL #3   Title Patient will be able to weightbear with min pa or less.   Time 12   Period Weeks   Status On-going   OT LONG TERM GOAL #4   Title Patient will decrease subluxation to 1 digit or less in right shoulder region.    Time 12   Period Weeks   Status On-going   OT LONG TERM GOAL #5   Title  Patient will attend to left independently.   Time 12   Period Weeks   OT LONG TERM GOAL #6   Title Patient will improve to Brunnstrom stage IV in LUE.   Time 12   Period Weeks   Status On-going   OT LONG TERM GOAL #7   Title Patient wll complete all ADLs and ambulation with Mod I.   Period Weeks               Plan - 11/01/14 1446    Clinical Impression Statement A: Patient refrained from shortening hand splint this date. Unable to locate Saebo splint for use this date. Discussed that patient could schedule one more appt before using all 50 visits.   Plan P: Reassess, discharge, update HEP if needed. Use saebo splint for digit extension during reaching and grasp activity.        Problem List Patient Active Problem List   Diagnosis Date Noted  . Seizures 12/02/2012  . AVM (arteriovenous malformation) 12/02/2012    Limmie Patricia, OTR/L,CBIS  [phone]  11/01/2014, 2:48 PM  [facility] [address] [place], [place], [address] Phone: [phone]   Fax:  [phone]

## 2014-11-08 ENCOUNTER — Ambulatory Visit (HOSPITAL_COMMUNITY): Payer: Federal, State, Local not specified - PPO | Admitting: Physical Therapy

## 2014-11-08 DIAGNOSIS — R269 Unspecified abnormalities of gait and mobility: Secondary | ICD-10-CM

## 2014-11-08 DIAGNOSIS — R29898 Other symptoms and signs involving the musculoskeletal system: Secondary | ICD-10-CM

## 2014-11-08 DIAGNOSIS — IMO0002 Reserved for concepts with insufficient information to code with codable children: Secondary | ICD-10-CM

## 2014-11-08 DIAGNOSIS — I69898 Other sequelae of other cerebrovascular disease: Secondary | ICD-10-CM | POA: Diagnosis not present

## 2014-11-08 DIAGNOSIS — R262 Difficulty in walking, not elsewhere classified: Secondary | ICD-10-CM

## 2014-11-08 NOTE — Therapy (Signed)
[place], [place], [address] Phone: [phone]   Fax:  [phone]  Physical Therapy Treatment  Patient Details  Name: Jennifer Pope MRN: [id number] Date of Birth: 06-Jan-1971 Referring Provider:  Carlos Levering, MD  Encounter Date: 11/08/2014      PT End of Session - 11/08/14 1207    Visit Number 20   Number of Visits 20   Date for PT Re-Evaluation 11/08/14   Authorization Type BCBS   Authorization - Visit Number 20   Authorization - Number of Visits 20   PT Start Time 0932   PT Stop Time 1128   PT Time Calculation (min) 70 min   Equipment Utilized During Treatment Gait belt   Activity Tolerance Patient tolerated treatment well   Behavior During Therapy [facility] for tasks assessed/performed      Past Medical History  Diagnosis Date  . Seizures   . Arteriovenous malformation   . Stroke     Past Surgical History  Procedure Laterality Date  . Ankle fracture surgery Right     2002    There were no vitals filed for this visit.  Visit Diagnosis:  Weakness due to cerebrovascular accident  Abnormality of gait  Left leg weakness  Difficulty walking      Subjective Assessment - 11/08/14 1021    Subjective Ms. Prestage states she is getting most of the things done at home that she needs to be doing.     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 Lt AFO for prevention of knee yper extension an dprevention of Lt foot drop. pain its predominantly in shoulder secondary to Lt shoulder subluxation for which patient will be seeing OT. patient arriveswwearing helmet for protection of brain flap.    How long can you sit comfortably? No problems    How long can you stand comfortably? able to stand for over an hour.    How long can you walk comfortably? Walks with and without her cane.  She has walked around the barn for 30-45 minutes.    Currently in Pain? No/denies             [facility] PT Assessment - 11/08/14 1028    Assessment   Medical Diagnosis Rt CVA   Onset Date/Surgical Date 05/23/14   Precautions   Precautions Fall   Required Braces or Orthoses Other Brace/Splint   Other Brace/Splint ankle AFO    Cognition   Overall Cognitive Status Within Functional Limits for tasks assessed   Observation/Other Assessments   Focus on Therapeutic Outcomes (FOTO)  56 was 60    Strength   Left Hip Flexion 3+/5  was 3-/5    Left Hip Extension 3-/5   Left Hip ABduction 3/5  2+/5    Right Knee Extension 5/5   Left Knee Flexion 2-/5  was 2-/5    Left Knee Extension 3/5  was 2+                     [facility] Adult PT Treatment/Exercise - 11/08/14 0001    Exercises   Exercises Ankle   Knee/Hip Exercises: Aerobic   Stationary Bike nustep L4 with t-band around legs to keep hip aligned hills Level 4 x 15:00   Knee/Hip Exercises: Seated   Long Arc Quad Left;5 reps   Knee/Hip Exercises: Supine   Bridges 10 reps   Straight Leg Raises Left;10 reps   Other Supine Knee/Hip Exercises hamset  x 10    Knee/Hip Exercises: Sidelying   Hip ABduction Strengthening;Left;10 reps   Other Sidelying Knee/Hip Exercises knee flexion x 10    Ankle Exercises: Supine   Isometrics x5 all directions    Other Supine Ankle Exercises AA ankle pumps and inversion/eversion x  10                 PT Education - 11/08/14 1206    Education provided Yes   Education Details new HEP; stressed to pt not to try and walk without her brace to gain ankle strength to complete exercises given to her and until she can complete sitting dorsiflexion/ plantarflexion keep brace on while walking.    Person(s) Educated Patient;Spouse   Methods Explanation;Demonstration;Handout   Comprehension Verbalized understanding;Returned demonstration          PT Short Term Goals - 11/08/14 1107    PT SHORT TERM GOAL #1   Title Patient will demosntrate increased ankel dorsiflexion strength of 2/5 MMT  to decrease risk of trippign over Lt foot due to Lt foot drop.   Time 4   Period Weeks   Status Not Met   PT SHORT TERM GOAL #2   Title Patient will demonstrate increased Lt hamstring/calf strength of 4-/5 MMT to decrease risk of Lt knee hyper extension.    Time 4   Period Weeks   Status Not Met   PT SHORT TERM GOAL #3   Title Patient will dmeosntrate increased Lt hip flexion of 4/5 MMT to be able to ambulate with improved stride length.    Time 4   Period Weeks   Status Not Met   PT SHORT TERM GOAL #4   Title patient will be abel to ambualte with a SPC for 100 ft   Baseline 7/11- 286f with SPC    Period Weeks   Status Achieved   PT SHORT TERM GOAL #5   Title I with HEP for progression of strength   Baseline 7/11- continuing to do it every day,    Period Weeks   Status Achieved           PT Long Term Goals - 11/08/14 1107    PT LONG TERM GOAL #1   Title Patient will demonstrate increased ankle dorsiflexion strength of 3/5 MMT to decrease risk of tripping over Lt foot due to Lt foot drop   Time 8   Period Weeks   Status Not Met   PT LONG TERM GOAL #2   Title Patient will demonstrate increased Lt hamstring/calf strength of 5/5 MMT to decrease risk of Lt knee hyper extension.    Time 8   Period Weeks   Status Not Met   PT LONG TERM GOAL #3   Title Patient will demosntrate increased Lt hip extension of 4/5 MMT to be able to ambulate with improved stride length and perofmr sit to stand without dependence of Rt LE for perofrmance of sit to stand.    Time 8   Period Weeks   Status Not Met   PT LONG TERM GOAL #4   Title Patient will be able to ambulate up and down stairs with only 1 HHA.    Baseline 7/11- patient able to navigate stairs with U railing    Status Achieved               Plan - 11/08/14 1210    Clinical Impression Statement Today is Ms. Spencers last physical therapy treatment secondary to insurance limitations.  Ms. Dizdarevic has incresased her  strength of all mm in her hip and knee but all mm remain weakened.  Her ankle strength has not improved but she has not been doing any active strengthening for her ankle ,( she thought walking would help with this).  She is accompanied by her husband.  The therapist went over what exercises would be important to complete and why, which exercises the pt could perform on her own and which ones she would need assistance  with from her husband.   Both vocalized understanding.    PT Next Visit Plan discharge to HEP secondary to insurance coverage.         Problem List Patient Active Problem List   Diagnosis Date Noted  . Seizures 12/02/2012  . AVM (arteriovenous malformation) 12/02/2012   PHYSICAL THERAPY DISCHARGE SUMMARY  Visits from Start of Care:20  Current functional level related to goals / functional outcomes: See above    Remaining deficits: See above    Education / Equipment: HEP  Plan: Patient agrees to discharge.  Patient goals were partially met. Patient is being discharged due to financial reasons.  ?????       Rayetta Humphrey, PT CLT [phone] 11/08/2014, 12:28 PM  [place] [address] [place], [place], [address] Phone: [phone]   Fax:  [phone]

## 2014-11-08 NOTE — Patient Instructions (Signed)
Plantar Flexion: Isometric   Press left foot into ball or rolled pillow against wall. Hold ___5_ seconds. Relax. Repeat __10__ times per set. Do ___1_ sets per session. Do _1-2___ sessions per day.  http://orth.exer.us/0   Copyright  VHI. All rights reserved.  Dorsiflexion: Isometric   With ball or rolled pillow between feet, squeeze feet together. Hold __5__ seconds. Relax. Repeat _10___ times per set. Do __1__ sets per session. Do 1-2____ sessions per day.  http://orth.exer.us/2   Copyright  VHI. All rights reserved.  Eversion: Isometric   Press outer border of right foot into ball or rolled pillow against wall. Hold ___5_ seconds. Relax. Repeat __10__ times per set. Do _1___ sets per session. Do1-2 ____ sessions per day.  http://orth.exer.us/4   Copyright  VHI. All rights reserved.  Inversion: Isometric   Press inner borders of feet into ball or rolled pillow between feet. Hold _5___ seconds. Relax. Repeat _10___ times per set. Do _1___ sets per session. Do 1-2____ sessions per day.  http://orth.exer.us/6   Copyright  VHI. All rights reserved.  Ankle Pump   With left leg elevated, gently flex and extend ankle. Move through full range of motion. Avoid pain. Repeat _10___ times per set. Do __1__ sets per session. Do __1-2__ sessions per day.  http://orth.exer.us/32   Copyright  VHI. All rights reserved.  ROM: Inversion / Eversion   With left leg relaxed, gently turn ankle and foot in and out. Move through full range of motion. Avoid pain. Repeat _10___ times per set. Do __1__ sets per session. Do 1-2____ sessions per day.  http://orth.exer.us/36   Copyright  VHI. All rights reserved.  Flexion: ROM (Side-Lying)   Position (A) Patient: Lie with left side up and top leg straight. Small pillow may be placed between knees. Helper: Stabilize hip. Place other hand around ankle. Motion (B) - Bend knee, bringing foot back toward buttock. - Do not allow hip to  move or bend.  May try and make a down hill slide to assist pt with this motion  Repeat 10-15___ times. Repeat with other leg. Do _1-2__ sessions per day. Variation: Patient is passive.  Copyright  VHI. All rights reserved.  Knee Extension (Sitting)   Place ____ pound weight on left ankle and straighten knee fully, lower slowly. Repeat __10-15__ times per set. Do _1___ sets per session. Do _2___ sessions per day.  http://orth.exer.us/732   Copyright  VHI. All rights reserved.  Bridging   Slowly raise buttocks from floor, keeping stomach tight. Repeat __10-15__ times per set. Do 1____ sets per session. Do ____2 sessions per day.  http://orth.exer.us/1096   Copyright  VHI. All rights reserved.  Strengthening: Straight Leg Raise (Phase 1)   Tighten muscles on front of right thigh, then lift leg _12___ inches from surface, keeping knee locked.  Repeat _10-15___ times per set. Do _1___ sets per session. Do __2__ sessions per day.  http://orth.exer.us/614   Copyright  VHI. All rights reserved.  Strengthening: Hip Abduction (Side-Lying)   Tighten muscles on front of left thigh, then lift leg __18__ inches from surface, keeping knee locked.  Repeat 10-15____ times per set. Do __1__ sets per session. Do __2__ sessions per day.  http://orth.exer.us/622   Copyright  VHI. All rights reserved.

## 2014-11-10 ENCOUNTER — Ambulatory Visit (HOSPITAL_COMMUNITY): Payer: Federal, State, Local not specified - PPO | Admitting: Specialist

## 2014-11-10 DIAGNOSIS — G8110 Spastic hemiplegia affecting unspecified side: Secondary | ICD-10-CM

## 2014-11-10 DIAGNOSIS — I69898 Other sequelae of other cerebrovascular disease: Secondary | ICD-10-CM | POA: Diagnosis not present

## 2014-11-10 DIAGNOSIS — S43112S Subluxation of left acromioclavicular joint, sequela: Secondary | ICD-10-CM

## 2014-11-10 NOTE — Patient Instructions (Signed)
Complete exercises daily!  10 times each 2-3 times.   Keep weightbearing and using your left arm with daily activities. Call Beth at [phone] with questions and keep [place] updated on your progress!  Hand Push   With empty paper cup in front and hand resting on baby finger, gently bend wrist back and push cup. Repeat ____ times. Do ____ sessions per day.  http://[email or web address].exer.us/738   Copyright  VHI. All rights reserved.  SELF ASSISTED WITH OBJECT: Shoulder Flexion / Elbow Extension (Rolling Pin)   Place hands on rolling pin, straighten arms. ___ reps per set, ___ sets per day, ___ days per week   Copyright  VHI. All rights reserved.  Scapular Retraction (Standing)   With arms at sides, pinch shoulder blades together. Repeat ____ times per set. Do ____ sets per session. Do ____ sessions per day.  http://orth.exer.us/945   Copyright  VHI. All rights reserved.  Elevation / Depression   While slowly inhaling, bring shoulders toward ears. Hold ___ seconds. Slowly exhale while relaxing shoulders backward and downward. Repeat ___ times. Do ___ times per day.  Copyright  VHI. All rights reserved.    Lying down, use your left arm to push your right arm out in front of you and then lift to the ceiling.

## 2014-11-10 NOTE — Therapy (Signed)
[place], [place], [address] Phone: [phone]   Fax:  [phone]  Occupational Therapy Treatment  Patient Details  Name: ANSLIE SPADAFORA MRN: [id number] Date of Birth: 02/03/1971 Referring Provider:  Carlos Levering, MD  Encounter Date: 11/10/2014      OT End of Session - 11/10/14 1143    Visit Number 25   Number of Visits 36   Date for OT Re-Evaluation 12/10/14   Authorization Type BCBS    Authorization Time Period 50 visit limit for PT/OT/SLP (change visit number to reflect all disciplines with each tx session)   Authorization - Visit Number 14   Authorization - Number of Visits 50   OT Start Time 0940   OT Stop Time 1025   OT Time Calculation (min) 45 min   Activity Tolerance Patient tolerated treatment well   Behavior During Therapy [facility] for tasks assessed/performed      Past Medical History  Diagnosis Date  . Seizures   . Arteriovenous malformation   . Stroke     Past Surgical History  Procedure Laterality Date  . Ankle fracture surgery Right     2002    There were no vitals filed for this visit.  Visit Diagnosis:  Spastic hemiplegia affecting nondominant side  Subluxation of left acromioclavicular joint, sequela      Subjective Assessment - 11/10/14 1137    Subjective  S:  I am going to keep working it myself.     Patient is accompained by: Family member   Currently in Pain? 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 Helmet on at all times when ambulating.    Required Braces or Orthoses Other Brace/Splint   Other Brace/Splint ankle AFO    Prior Function   Level of Independence Independent with basic ADLs   ADL   ADL comments Cerina is able to stay at home for extended periods of time alone.  She is  dressing, bathing, grooming, and toileting independently.  She does need assist entering the shower, and then can bathe herself.  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  She is using her right arm with all activities and attempts to use her left arm as a gross assist.    Palpation   Palpation comment 1/2 digit subluxation (was 2 digit)   AROM   Overall AROM Comments assessed in supine   Left Shoulder Flexion 70 Degrees   Left Shoulder ABduction 70 Degrees   Left Shoulder Internal Rotation 85 Degrees   Left Shoulder External Rotation --  to neurtal   Left Elbow Flexion 120   Left Elbow Extension 0   Left Forearm Pronation 90 Degrees   Left Forearm Supination 70 Degrees   Left Wrist Extension 0 Degrees   Left Wrist Flexion 70 Degrees   Left Composite Finger Extension 25%   Left Composite Finger Flexion 75%   PROM   Overall PROM Comments PROM of LUE is [facility]   Strength   Left Shoulder Flexion 3-/5   Left Shoulder ABduction 3-/5   Left Shoulder Internal Rotation 1/5   Left Shoulder External Rotation 1/5   Left Elbow Flexion 3-/5   Left Elbow Extension 3-/5  Left Forearm Pronation 1/5   Left Forearm Supination 2-/5   Left Wrist Flexion 1/5   Left Wrist Extension 1/5                          OT Education - 11/10/14 1142    Education provided Yes   Education Details HEP:  AROM elevation, retraction, rolling pin on table for AAROM of shoulder and elbow, wrist extension.  Continue use of LUE with all daily tasks and weightbearing.     Person(s) Educated Patient;Parent(s)   Methods Explanation;Demonstration;Handout   Comprehension Verbalized understanding;Returned demonstration          OT Short Term Goals - 11/01/14 1447    OT SHORT TERM GOAL #1   Title Patient will be educated on a HEP.   Time 6   Period Weeks   OT SHORT TERM GOAL #2   Title Patient will use her left arm as a gross assist with daily activites.    Time 6   Period Weeks   OT SHORT TERM GOAL #3   Title Patient will decrease subluxation in left shoulder to 1.5 digits for decreased pain with shoulder use.   Time 6   Period Weeks   OT SHORT TERM GOAL #4   Title Patient will weightbear on her left arm with max pa while competing functional activiites.    Time 6   Period Weeks   OT SHORT TERM GOAL #5   Title Patient will have trace mobility in her LUE for increased ability to complete BADLs.   Time 6   Period Weeks   OT SHORT TERM GOAL #6   Title Patient will complete bathing and dressing with close min guard.    Period Weeks   OT SHORT TERM GOAL #7   Title Patient will attend to left side during ADLs and functional ambulation 50% of time.    Period Weeks           OT Long Term Goals - 11/10/14 1145    OT LONG TERM GOAL #1   Title Patient will use her left arm as an active assist with daily tasks.   Time 12   Period Weeks   Status On-going   OT LONG TERM GOAL #2   Title Patient will have 50% volitional movement in her left arm.   Time 12   Period Weeks   Status Achieved   OT LONG TERM GOAL #3   Title Patient will be able to weightbear with min pa or less.   Time 12   Period Weeks   Status Achieved   OT LONG TERM GOAL #4   Title Patient will decrease subluxation to 1 digit or less in right shoulder region.    Time 12   Period Weeks   Status Achieved   OT LONG TERM GOAL #5   Title Patient will attend to left independently.   Time 12   Period Weeks   Status Achieved   OT LONG TERM GOAL #6   Title Patient will improve to Brunnstrom stage IV in LUE.   Time 12   Period Weeks   Status On-going   OT LONG TERM GOAL #7   Title Patient wll complete all ADLs and ambulation with Mod I.   Period Weeks   Status Achieved               Plan - 11/10/14 1143    Clinical Impression Statement A:  Patient is continuing to make progress towards increased volitional use of her LUE with daily tasks.  She is discharging  from therapy this date due to lack of insurance coverage.  Patient educated on updated HEP and was able to return demonstration.  We discussed the opportunity to take a driving assessment and patient is looking into it.  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    Plan P:  DC from skilled OT intervention with patient to continue HEP due to lack of insurance coverage.          Problem List Patient Active Problem List   Diagnosis Date Noted  . Seizures 12/02/2012  . AVM (arteriovenous malformation) 12/02/2012    Vangie Bicker, OTR/L [phone]  11/10/2014, 11:47 AM  [place], [place], [address] Phone: [phone]   Fax:  [phone]  OCCUPATIONAL THERAPY DISCHARGE SUMMARY  Visits from Start of Care: 25  Current functional level related to goals / functional outcomes: See above   Remaining deficits: See above   Education / Equipment: See above  Plan: Patient agrees to discharge.  Patient goals were partially met. Patient is being discharged due to financial reasons.  ?????       Vangie Bicker, OTR/L [phone]

## 2015-01-25 DEATH — deceased

## 2015-05-30 ENCOUNTER — Encounter (HOSPITAL_COMMUNITY): Payer: Self-pay
